# Patient Record
Sex: Male | Born: 1985
Health system: Southern US, Community
[De-identification: ages and names within clinical notes are randomized; demographics above are authoritative.]

## PROBLEM LIST (undated history)

## (undated) DIAGNOSIS — J45909 Unspecified asthma, uncomplicated: Secondary | ICD-10-CM

---
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
## 2008-12-15 ENCOUNTER — Emergency Department (HOSPITAL_COMMUNITY): Admission: EM | Admit: 2008-12-15 | Discharge: 2008-12-15 | Payer: Self-pay | Admitting: Emergency Medicine

## 2011-03-12 LAB — DIFFERENTIAL
Basophils Absolute: 0 10*3/uL (ref 0.0–0.1)
Basophils Relative: 0 % (ref 0–1)
Eosinophils Absolute: 0 10*3/uL (ref 0.0–0.7)
Eosinophils Relative: 0 % (ref 0–5)
Monocytes Absolute: 0.9 10*3/uL (ref 0.1–1.0)
Neutro Abs: 12.5 10*3/uL — ABNORMAL HIGH (ref 1.7–7.7)

## 2011-03-12 LAB — CBC
HCT: 44.7 % (ref 39.0–52.0)
Hemoglobin: 14.7 g/dL (ref 13.0–17.0)
MCHC: 32.8 g/dL (ref 30.0–36.0)
Platelets: 200 10*3/uL (ref 150–400)
RDW: 12.1 % (ref 11.5–15.5)

## 2011-03-12 LAB — RAPID STREP SCREEN (MED CTR MEBANE ONLY): Streptococcus, Group A Screen (Direct): NEGATIVE

## 2011-06-09 ENCOUNTER — Emergency Department (HOSPITAL_COMMUNITY)
Admission: EM | Admit: 2011-06-09 | Discharge: 2011-06-09 | Disposition: A | Discharge status: Home / Self Care | Payer: Self-pay | Attending: Emergency Medicine | Admitting: Emergency Medicine

## 2011-06-09 DIAGNOSIS — J45909 Unspecified asthma, uncomplicated: Secondary | ICD-10-CM | POA: Insufficient documentation

## 2011-06-09 DIAGNOSIS — R634 Abnormal weight loss: Secondary | ICD-10-CM | POA: Insufficient documentation

## 2011-06-09 DIAGNOSIS — K5900 Constipation, unspecified: Secondary | ICD-10-CM | POA: Insufficient documentation

## 2011-06-09 DIAGNOSIS — R109 Unspecified abdominal pain: Secondary | ICD-10-CM | POA: Insufficient documentation

## 2011-06-09 DIAGNOSIS — IMO0001 Reserved for inherently not codable concepts without codable children: Secondary | ICD-10-CM | POA: Insufficient documentation

## 2011-06-09 DIAGNOSIS — R110 Nausea: Secondary | ICD-10-CM | POA: Insufficient documentation

## 2011-06-09 LAB — DIFFERENTIAL
Basophils Absolute: 0.1 10*3/uL (ref 0.0–0.1)
Lymphs Abs: 2.6 10*3/uL (ref 0.7–4.0)
Monocytes Absolute: 0.6 10*3/uL (ref 0.1–1.0)
Neutrophils Relative %: 45 % (ref 43–77)

## 2011-06-09 LAB — COMPREHENSIVE METABOLIC PANEL
ALT: 16 U/L (ref 0–53)
AST: 18 U/L (ref 0–37)
Alkaline Phosphatase: 54 U/L (ref 39–117)
CO2: 27 mEq/L (ref 19–32)
Chloride: 104 mEq/L (ref 96–112)
GFR calc Af Amer: >60 mL/min (ref >60)
GFR calc non Af Amer: >60 mL/min (ref >60)
Glucose, Bld: 83 mg/dL (ref 70–99)
Potassium: 4 mEq/L (ref 3.5–5.1)

## 2011-06-09 LAB — URINALYSIS, ROUTINE W REFLEX MICROSCOPIC
Bilirubin Urine: NEGATIVE
Hgb urine dipstick: NEGATIVE
Ketones, ur: NEGATIVE mg/dL
Nitrite: NEGATIVE
Urobilinogen, UA: 1 mg/dL (ref 0.0–1.0)

## 2011-06-09 LAB — CBC
MCHC: 34.8 g/dL (ref 30.0–36.0)
Platelets: 247 10*3/uL (ref 150–400)
RDW: 11.5 % (ref 11.5–15.5)
WBC: 6.3 10*3/uL (ref 4.0–10.5)

## 2011-06-09 LAB — CK TOTAL AND CKMB (NOT AT ARMC)
CK, MB: 1 ng/mL (ref 0.3–4.0)
Total CK: 109 U/L (ref 7–232)

## 2013-07-07 ENCOUNTER — Emergency Department (HOSPITAL_COMMUNITY)
Admission: EM | Admit: 2013-07-07 | Discharge: 2013-07-07 | Disposition: A | Discharge status: Home Health | Payer: Self-pay | Attending: Emergency Medicine | Admitting: Emergency Medicine

## 2013-07-07 ENCOUNTER — Encounter (HOSPITAL_COMMUNITY): Payer: Self-pay | Admitting: Emergency Medicine

## 2013-07-07 DIAGNOSIS — S61209A Unspecified open wound of unspecified finger without damage to nail, initial encounter: Secondary | ICD-10-CM | POA: Insufficient documentation

## 2013-07-07 DIAGNOSIS — W268XXA Contact with other sharp object(s), not elsewhere classified, initial encounter: Secondary | ICD-10-CM | POA: Insufficient documentation

## 2013-07-07 DIAGNOSIS — Z23 Encounter for immunization: Secondary | ICD-10-CM | POA: Insufficient documentation

## 2013-07-07 DIAGNOSIS — Y9289 Other specified places as the place of occurrence of the external cause: Secondary | ICD-10-CM | POA: Insufficient documentation

## 2013-07-07 DIAGNOSIS — Y9389 Activity, other specified: Secondary | ICD-10-CM | POA: Insufficient documentation

## 2013-07-07 DIAGNOSIS — S61219A Laceration without foreign body of unspecified finger without damage to nail, initial encounter: Secondary | ICD-10-CM

## 2013-07-07 DIAGNOSIS — S61409A Unspecified open wound of unspecified hand, initial encounter: Secondary | ICD-10-CM | POA: Insufficient documentation

## 2013-07-07 MED ORDER — LIDOCAINE HCL 2 % IJ SOLN: 10.0000 mL | Freq: Once | INTRAMUSCULAR | Status: DC

## 2013-07-07 MED ORDER — HYDROCODONE-ACETAMINOPHEN 5-325 MG PO TABS: 2.0000 | ORAL_TABLET | ORAL | Status: DC | PRN

## 2013-07-07 MED ORDER — TETANUS-DIPHTH-ACELL PERTUSSIS 5-2.5-18.5 LF-MCG/0.5 IM SUSP
0.5000 mL | Freq: Once | INTRAMUSCULAR | Status: AC
  Administered 2013-07-07: 0.5 mL via INTRAMUSCULAR
  Filled 2013-07-07: qty 0.5

## 2013-07-07 NOTE — ED Provider Notes (Signed)
  CSN: [id number]     Arrival date & time 07/07/13  0100 History     First MD Initiated Contact with Patient 07/07/13 0118     Chief Complaint  Patient presents with  . Hand Injury   (Consider location/radiation/quality/duration/timing/severity/associated sxs/prior Treatment) Patient is a 27 y.o. male presenting with hand injury. The history is provided by the patient. No language interpreter was used.  Hand Injury Associated symptoms: no fever     27 year old male presents for evaluations of left middle finger laceration. Incident happened 1 hr ago.  Patient sustained a cut to his L middle and ring fingers this evening while trying to reach under the car seat. Unsure what cut his fingers.  Denies numbness, or any other injury.  Unsure date of last tetanus.    History reviewed. No pertinent past medical history. History reviewed. No pertinent past surgical history. No family history on file. History  Substance Use Topics  . Smoking status: Not on file  . Smokeless tobacco: Not on file  . Alcohol Use: Not on file    Review of Systems  Constitutional: Negative for fever.  Skin: Positive for wound.  Neurological: Negative for numbness.    Allergies  Review of patient's allergies indicates no known allergies.  Home Medications  No current outpatient prescriptions on file. BP 124/61  Pulse 78  Temp(Src) 98.9 F (37.2 C) (Oral)  Resp 14  SpO2 100% Physical Exam  Constitutional: He appears well-developed and well-nourished. No distress.  HENT:  Head: Atraumatic.  Eyes: Conjunctivae are normal.  Neck: Neck supple.  Musculoskeletal: He exhibits tenderness (superficial laceration noted overlying 1st phalanx of L middle and ring finger.  NVI, no joint involvement, normal ROM through each joints distally.  no fb.).  Skin: Skin is warm and dry.    ED Course   Procedures (including critical care time)  LACERATION REPAIR Performed by: Fayrene Helper Authorized by:  Fayrene Helper Consent: Verbal consent obtained. Risks and benefits: risks, benefits and alternatives were discussed Consent given by: patient Patient identity confirmed: provided demographic data Prepped and Draped in normal sterile fashion Wound explored  Laceration Location: L middle finger, dorsum  Laceration Length: 3cm  No Foreign Bodies seen or palpated  Anesthesia: local infiltration  Local anesthetic: lidocaine 2% w/o epinephrine  Anesthetic total: 1 ml  Irrigation method: syringe Amount of cleaning: standard  Skin closure: prolene 5.0  Number of sutures: 6  Technique: simple interrupted  Patient tolerance: Patient tolerated the procedure well with no immediate complications.  LACERATION REPAIR Performed by: Fayrene Helper Authorized byFayrene Helper Consent: Verbal consent obtained. Risks and benefits: risks, benefits and alternatives were discussed Consent given by: patient Patient identity confirmed: provided demographic data Prepped and Draped in normal sterile fashion Wound explored  Laceration Location: L ring finger, dorsum  Laceration Length: 2cm  No Foreign Bodies seen or palpated  Anesthesia: local infiltration  Local anesthetic: lidocaine 2% w/o epinephrine  Anesthetic total: 1 ml  Irrigation method: syringe Amount of cleaning: standard  Skin closure: prolene 5.0  Number of sutures: 4  Technique: simple interrupted  Patient tolerance: Patient tolerated the procedure well with no immediate complications.    Labs Reviewed - No data to display No results found. 1. Laceration of multiple sites of left hand and fingers without complication     MDM  BP 124/61  Pulse 78  Temp(Src) 98.9 F (37.2 C) (Oral)  Resp 14  SpO2 100%   Fayrene Helper, PA-C 07/07/13 [id number]

## 2013-07-07 NOTE — ED Notes (Signed)
Suture cart at bedside 

## 2013-07-07 NOTE — ED Notes (Signed)
PT. PRESENTS WITH LACERATION AT LEFT MIDDLE Frank Ferguson FINGER SUSTAINED THIS EVENING WHILE TRYING TO REACH UNDER A CAR SEAT . DRESSING APPLIED AT TRIAGE .

## 2013-07-07 NOTE — ED Notes (Signed)
Pt has lacerations to left middle and ring finger. Pressure dressing was applied in triage and bleeding is controlled for now. Pt is a/o x4, NAD. Pt is unsure of last tetanus booster, but states "it has been a while."

## 2013-07-07 NOTE — ED Provider Notes (Signed)
Medical screening examination/treatment/procedure(s) were performed by non-physician practitioner and as supervising physician I was immediately available for consultation/collaboration.  John-Adam Tanmay Halteman, M.D.     John-Adam Kahley Leib, MD 07/07/13 0526 

## 2014-03-04 ENCOUNTER — Emergency Department: Payer: Self-pay | Admitting: General Practice

## 2014-11-13 ENCOUNTER — Encounter (HOSPITAL_COMMUNITY): Payer: Self-pay | Admitting: Emergency Medicine

## 2014-11-13 ENCOUNTER — Emergency Department (HOSPITAL_COMMUNITY)
Admission: EM | Admit: 2014-11-13 | Discharge: 2014-11-13 | Disposition: A | Discharge status: Home / Self Care | Payer: Self-pay | Attending: Emergency Medicine | Admitting: Emergency Medicine

## 2014-11-13 DIAGNOSIS — J45909 Unspecified asthma, uncomplicated: Secondary | ICD-10-CM | POA: Insufficient documentation

## 2014-11-13 DIAGNOSIS — K029 Dental caries, unspecified: Secondary | ICD-10-CM | POA: Insufficient documentation

## 2014-11-13 DIAGNOSIS — Z720 Tobacco use: Secondary | ICD-10-CM | POA: Insufficient documentation

## 2014-11-13 DIAGNOSIS — K0381 Cracked tooth: Secondary | ICD-10-CM | POA: Insufficient documentation

## 2014-11-13 HISTORY — DX: Unspecified asthma, uncomplicated: J45.909

## 2014-11-13 MED ORDER — HYDROCODONE-ACETAMINOPHEN 5-325 MG PO TABS: 1.0000 | ORAL_TABLET | ORAL | Status: DC | PRN

## 2014-11-13 MED ORDER — PENICILLIN V POTASSIUM 500 MG PO TABS: 500.0000 mg | ORAL_TABLET | Freq: Four times a day (QID) | ORAL | Status: AC

## 2014-11-13 NOTE — ED Notes (Signed)
Patient reports having a toothache the last few days with worsening pain. He took advil with only temporary relief.  Pain in left upper jaw, 10/10.

## 2014-11-13 NOTE — Discharge Instructions (Signed)
Dental Care and Dentist Visits Dental care supports good overall health. Regular dental visits can also help you avoid dental pain, bleeding, infection, and other more serious health problems in the future. It is important to keep the mouth healthy because diseases in the teeth, gums, and other oral tissues can spread to other areas of the body. Some problems, such as diabetes, heart disease, and pre-term labor have been associated with poor oral health.  See your dentist every 6 months. If you experience emergency problems such as a toothache or broken tooth, go to the dentist right away. If you see your dentist regularly, you may catch problems early. It is easier to be treated for problems in the early stages.  WHAT TO EXPECT AT A DENTIST VISIT  Your dentist will look for many common oral health problems and recommend proper treatment. At your regular dental visit, you can expect:  Gentle cleaning of the teeth and gums. This includes scraping and polishing. This helps to remove the sticky substance around the teeth and gums (plaque). Plaque forms in the mouth shortly after eating. Over time, plaque hardens on the teeth as tartar. If tartar is not removed regularly, it can cause problems. Cleaning also helps remove stains.  Periodic X-rays. These pictures of the teeth and supporting bone will help your dentist assess the health of your teeth.  Periodic fluoride treatments. Fluoride is a natural mineral shown to help strengthen teeth. Fluoride treatmentinvolves applying a fluoride gel or varnish to the teeth. It is most commonly done in children.  Examination of the mouth, tongue, jaws, teeth, and gums to look for any oral health problems, such as:  Cavities (dental caries). This is decay on the tooth caused by plaque, sugar, and acid in the mouth. It is best to catch a cavity when it is small.  Inflammation of the gums caused by plaque buildup (gingivitis).  Problems with the mouth or malformed  or misaligned teeth.  Oral cancer or other diseases of the soft tissues or jaws. KEEP YOUR TEETH AND GUMS HEALTHY For healthy teeth and gums, follow these general guidelines as well as your dentist's specific advice:  Have your teeth professionally cleaned at the dentist every 6 months.  Brush twice daily with a fluoride toothpaste.  Floss your teeth daily.  Ask your dentist if you need fluoride supplements, treatments, or fluoride toothpaste.  Eat a healthy diet. Reduce foods and drinks with added sugar.  Avoid smoking. TREATMENT FOR ORAL HEALTH PROBLEMS If you have oral health problems, treatment varies depending on the conditions present in your teeth and gums.  Your caregiver will most likely recommend good oral hygiene at each visit.  For cavities, gingivitis, or other oral health disease, your caregiver will perform a procedure to treat the problem. This is typically done at a separate appointment. Sometimes your caregiver will refer you to another dental specialist for specific tooth problems or for surgery. SEEK IMMEDIATE DENTAL CARE IF:  You have pain, bleeding, or soreness in the gum, tooth, jaw, or mouth area.  A permanent tooth becomes loose or separated from the gum socket.  You experience a blow or injury to the mouth or jaw area. Document Released: 07/25/2011 Document Revised: 02/04/2012 Document Reviewed: 07/25/2011 [facility] Patient Information 2015 [place], [place]. This information is not intended to replace advice given to you by your health care provider. Make sure you discuss any questions you have with your health care provider.  Dental Fracture You have a dental fracture or  injury. This can mean the tooth is loose, has a chip in the enamel or is broken. If just the outer enamel is chipped, there is a good chance the tooth will not become infected. The only treatment needed may be to smooth off a rough edge. Fractures into the deeper layers (dentin and pulp)  cause greater pain and are more likely to become infected. These require you to see a dentist as soon as possible to save the tooth. Loose teeth may need to be wired or bonded with a plastic splint to hold them in place. A paste may be painted on the open area of the broken tooth to reduce the pain. Antibiotics and pain medicine may be prescribed. Choosing a soft or liquid diet and rinsing the mouth out with warm water after meals may be helpful. See your dentist as recommended. Failure to seek care or follow up with a dentist or other specialist as recommended could result in the loss of your tooth, infection, or permanent dental problems. SEEK MEDICAL CARE IF:   You have increased pain not controlled with medicines.  You have swelling around the tooth, in the face or neck.  You have bleeding which starts, continues, or gets worse. Dental Caries Dental caries is tooth decay. This decay can cause a hole in teeth (cavity) that can get bigger and deeper over time. HOME CARE Brush and floss your teeth. Do this at least two times a day. Use a fluoride toothpaste. Use a mouth rinse if told by your dentist or doctor. Eat less sugary and starchy foods. Drink less sugary drinks. Avoid snacking often on sugary and starchy foods. Avoid sipping often on sugary drinks. Keep regular checkups and cleanings with your dentist. Use fluoride supplements if told by your dentist or doctor. Allow fluoride to be applied to teeth if told by your dentist or doctor. Document Released: 08/21/2008 Document Revised: 03/29/2014 Document Reviewed: 11/14/2012 [facility] Patient Information 2015 [place], [place]. This information is not intended to replace advice given to you by your health care provider. Make sure you discuss any questions you have with your health care provider.  Emergency Department Resource Guide 1) Find a Doctor and Pay Out of Pocket Although you won't have to find out who is covered by your insurance  plan, it is a good idea to ask around and get recommendations. You will then need to call the office and see if the doctor you have chosen will accept you as a new patient and what types of options they offer for patients who are self-pay. Some doctors offer discounts or will set up payment plans for their patients who do not have insurance, but you will need to ask so you aren't surprised when you get to your appointment.  2) Contact Your Local Health Department Not all health departments have doctors that can see patients for sick visits, but many do, so it is worth a call to see if yours does. If you don't know where your local health department is, you can check in your phone book. The CDC also has a tool to help you locate your state's health department, and many state websites also have listings of all of their local health departments.  3) Find a Walk-in Clinic If your illness is not likely to be very severe or complicated, you may want to try a walk in clinic. These are popping up all over the country in pharmacies, drugstores, and shopping centers. They're usually staffed by nurse practitioners  or physician assistants that have been trained to treat common illnesses and complaints. They're usually fairly quick and inexpensive. However, if you have serious medical issues or chronic medical problems, these are probably not your best option.  No Primary Care Doctor: - Call Health Connect at  [phone] - they can help you locate a primary care doctor that  accepts your insurance, provides certain services, etc. - Physician Referral Service- [phone]  Chronic Pain Problems: Organization         Address  Phone   Notes  [facility]  [phone] Patients need to be referred by their primary care doctor.   Medication Assistance: Organization         Address  Phone   Notes  [facility] Medication [facility] [address] [place], [place] [address]  [phone] --Must be a resident of [facility] -- Must have NO insurance coverage whatsoever (no Medicaid/ Medicare, etc.) -- The pt. MUST have a primary care doctor that directs their care regularly and follows them in the community   [facility]  [phone]   [employer]  [phone]    Agencies that provide inexpensive medical care: Organization         Address                                                       Phone                                                                            Notes  [facility]  [phone]   [facility]    [phone]   [facility] [address] [place], [place] [address] [phone]   [facility] [address] [place] [place] [phone]   Planned Parenthood    [phone]   [facility]    [phone]   [facility]  [address], [place] Phone:  [phone], Fax:  [phone] Hours of Operation:  9 am - 6 pm, M-F.  Also accepts Medicaid/Medicare and self-pay.  [facility]  [address], [place] Phone: [phone], Fax: [phone]. Hours of Operation:  8:30 am - 5:30 pm, M-F.  Also accepts Medicaid and self-pay.  [facility] [place] [address] [place] Phone: [phone]   [facility] [address] Natasha Bence [place], [place] [phone], Ext. [phone] Mondays & Thursdays: 7-9 AM.  First 15 patients are seen on a first come, first serve basis.    Medicaid-accepting [facility] Providers:  Organization         Address  Phone                               Notes  [facility] [address], [place] [phone] Also accepts self-pay patients.  [facility] [address], [place]  [phone]   [facility] [address], [place] [phone]) [phone]   [facility] [address] [place] [address], [place] [phone]) [phone]   Renaye RakersVeita Bland [address], [place]   [phone] Only accepts [place] Access [place] patients after they have their name applied to their card.   Self-Pay (no insurance) in [facility]:   Organization         Address                                                     Phone               Notes  Sickle Cell Patients, [facility] [address], [place] [phone]   [facility] [address], [place] [phone]   [facility]  [address], [place] [phone]   [facility]/Dr. Osei-Bonsu  [address] [place] [address], [place] or [phone], [place] [phone] Phone number for both [place] and [place] locations is the same.  [facility] [address], [place] [phone]) [phone]   [facility]  [address] [place] [address], [place] or [address] [phone]   [facility] [address], [place] [phone], phone; [phone], fax Sees patients 1st and 3rd Saturday of every month.  Must not qualify for public or private insurance (i.e. Medicaid, Medicare, [place] Health Choice, Veterans' Benefits)  Household income should be no more than 200% of the poverty level The clinic cannot treat you if you are pregnant or think you are pregnant  Sexually transmitted diseases are not treated at the clinic.   Dental Care: Organization         Address                                  Phone                       Notes  [facility] [address], [place] [phone] Accepts children up to age 26 who are enrolled in [place] or [place] Health Choice; pregnant women  with a Medicaid card; and children who have applied for Medicaid or [place] Health Choice, but were declined, whose parents can pay a reduced fee at time of service.  [facility] [place]  [address], [place] [phone] Accepts children up to age 26 who are enrolled in [place] or [place] Health Choice; pregnant women with a Medicaid card; and children who have applied for Medicaid or [place] Health Choice, but were declined, whose parents can pay a reduced fee at time of  service.  [facility]  [address] [place], [place] [phone] Patients are seen by appointment only. Walk-ins are not accepted. [facility] will see patients 84 years of age and older. Monday - Tuesday (8am-5pm) Most Wednesdays (8:30-5pm) $30 per visit, cash only  [facility] Adult [facility] Access [facility]  [address], [facility] [phone] Patients are seen by appointment only. Walk-ins are not accepted. [facility] will see patients 43 years of age and older. One Wednesday Evening (Monthly: Volunteer Based).  $30 per visit, cash only  [employer]  [phone] for adults; Children under age 21, call Graduate Pediatric Dentistry at [phone]. Children aged 58-14, please call [phone] to request a pediatric application.  Dental services are provided in all areas of dental care including fillings, crowns and bridges, complete and partial dentures, implants, gum treatment, root canals, and extractions. Preventive care is also provided. Treatment is provided to both adults and children. Patients are selected via a lottery and there is often a waiting list.   [facility] [address] [place]  [phone] [email or web address]   [facility] [address] [place], [place] [phone], Ext. [phone] Second and Fourth Thursday of each month, opens at 6:30 AM; Clinic ends at 9 AM.  Patients are seen on  a first-come first-served basis, and a limited number are seen during each clinic.   [facility]  [address] Ether Griffins [place], [place] [phone]   Eligibility Requirements You must have lived in [place], [place], or [place] counties for at least the last three months.   You cannot be eligible for state or federal sponsored [place], including [employer], [place], or [employer].   You generally cannot be eligible for healthcare insurance through your employer.    How to apply: Eligibility screenings are held every Tuesday and Wednesday afternoon from 1:00 pm until 4:00 pm. You do not need an appointment for the interview!  [facility] [address] [place], [place] [phone]   [facility] Health Department  [phone]   [facility] Health Department  [phone]   [facility] Health Department  [phone]     Dental Resources   Patients with Medicaid: [facility] [phone] [address], [phone] [address], [phone]  If unable to pay, or uninsured, contact [facility] [phone]) or [facility] Department [phone] in [place], [phone] in [facility]) to become qualified for the adult dental clinic  Other Low-Cost Community Dental Services: [facility] [address] Natasha Bence [place], [place], [address]    [phone], Ext. [phone]    2nd and 4th Thursday of the month at 6:30am    10 clients each day by appointment, can sometimes see walk-in patients if someone does not show for an appointment [facility]- [address] Ether Griffins [place], [place], [address]    [phone] [facility] [address], [place], [place], [address]    [phone]  [facility] Health Department- [phone] [facility] Health Department- [phone] [facility] [phone]   You have a fever. Document Released: 12/20/2004 Document Revised:  02/04/2012 Document Reviewed: 10/04/2009 [facility] Patient Information 2015 [place], [place]. This information is not intended to replace advice given to you by your health care provider. Make sure you discuss any questions you have with your health care provider.

## 2014-11-13 NOTE — ED Provider Notes (Signed)
CSN: [id number]     Arrival date & time 11/13/14  [id number] History   First MD Initiated Contact with Patient 11/13/14 [phone]     Chief Complaint  Patient presents with  . Dental Pain     (Consider location/radiation/quality/duration/timing/severity/associated sxs/prior Treatment) HPI  Pt is a 28yo male presenting to ED with c/o gradually worsening left upper tooth pain that started about 1 week ago. Pain is constant, aching and throbbing, waxes and wanes in severity, 10/10 at worse, radiates along left side of face. Has tried Advil with only temporary relief.  States he has a known cracked tooth with fillings but has not f/u with a dentist in "many years"  States he was trying to make it until January 1st when his insurance kicks in but pain was too severe this morning.  Denies fever, chills, n/v/d. Denies difficulty breathing or swallowing.  Past Medical History  Diagnosis Date  . Asthma     as a child   History reviewed. No pertinent past surgical history. History reviewed. No pertinent family history. History  Substance Use Topics  . Smoking status: Current Some Day Smoker  . Smokeless tobacco: Never Used  . Alcohol Use: No    Review of Systems  Constitutional: Negative for fever and chills.  HENT: Positive for dental problem ( left upper dental pain). Negative for congestion, facial swelling, sore throat, trouble swallowing and voice change.   Respiratory: Negative for cough and shortness of breath.   All other systems reviewed and are negative.     Allergies  Review of patient's allergies indicates no known allergies.  Home Medications   Prior to Admission medications   Medication Sig Start Date End Date Taking? Authorizing Provider  ibuprofen (ADVIL,MOTRIN) 200 MG tablet Take 200 mg by mouth every 6 (six) hours as needed for moderate pain.   Yes Historical Provider, MD  HYDROcodone-acetaminophen (NORCO/VICODIN) 5-325 MG per tablet Take 1-2 tablets by mouth every 4 (four)  hours as needed. 11/13/14   Junius FinnerErin O'Malley, PA-C  penicillin v potassium (VEETID) 500 MG tablet Take 1 tablet (500 mg total) by mouth 4 (four) times daily. 11/13/14 11/20/14  Junius FinnerErin O'Malley, PA-C   BP 116/72 mmHg  Pulse 90  Temp(Src) 98.7 F (37.1 C) (Oral)  Resp 18  Ht 5\' 9"  (1.753 m)  Wt 140 lb (63.504 kg)  BMI 20.67 kg/m2  SpO2 98% Physical Exam  Constitutional: He is oriented to person, place, and time. He appears well-developed and well-nourished.  HENT:  Head: Normocephalic and atraumatic.  Nose: Nose normal.  Mouth/Throat: Uvula is midline, oropharynx is clear and moist and mucous membranes are normal. No trismus in the jaw. Abnormal dentition. Dental caries present. No dental abscesses or uvula swelling.    Multiple silver fillings.  Cracked tooth #13. Tenderness to surrounding gingiva w/o gingival erythema or edema. No facial swelling. No discharge or bleeding.  Eyes: EOM are normal.  Neck: Normal range of motion. Neck supple.  Cardiovascular: Normal rate.   Pulmonary/Chest: Effort normal.  Musculoskeletal: Normal range of motion.  Neurological: He is alert and oriented to person, place, and time.  Skin: Skin is warm and dry.  Psychiatric: He has a normal mood and affect. His behavior is normal.  Nursing note and vitals reviewed.   ED Course  Procedures (including critical care time) Labs Review Labs Reviewed - No data to display  Imaging Review No results found.   EKG Interpretation None      MDM   Final diagnoses:  Pain due to dental caries  Cracked tooth    Pt is a 28yo male c/o dental pain. Cracked tooth with multiple fillings present on exam. No gingival abscess. Tooth is tender to touch. No airway involvement. Will tx pain. Rx: norco and PCN. Home care instructions provided. Advised to use dental resource guide provided to establish care with a dentist for further evaluation and treatment. Pt verbalized understanding and agreement with tx  plan.    Junius FinnerErin O'Malley, PA-C 11/13/14 0745  Tomasita CrumbleAdeleke Oni, MD 11/13/14 [phone]

## 2016-02-13 ENCOUNTER — Encounter (HOSPITAL_COMMUNITY): Payer: Self-pay | Admitting: Emergency Medicine

## 2016-02-13 ENCOUNTER — Emergency Department (HOSPITAL_COMMUNITY)
Admission: EM | Admit: 2016-02-13 | Discharge: 2016-02-13 | Disposition: A | Discharge status: Home / Self Care | Payer: BLUE CROSS/BLUE SHIELD | Attending: Emergency Medicine | Admitting: Emergency Medicine

## 2016-02-13 DIAGNOSIS — K5900 Constipation, unspecified: Secondary | ICD-10-CM | POA: Diagnosis present

## 2016-02-13 DIAGNOSIS — F17200 Nicotine dependence, unspecified, uncomplicated: Secondary | ICD-10-CM | POA: Insufficient documentation

## 2016-02-13 DIAGNOSIS — A64 Unspecified sexually transmitted disease: Secondary | ICD-10-CM

## 2016-02-13 DIAGNOSIS — J45909 Unspecified asthma, uncomplicated: Secondary | ICD-10-CM | POA: Insufficient documentation

## 2016-02-13 LAB — COMPREHENSIVE METABOLIC PANEL
ALT: 14 U/L — ABNORMAL LOW (ref 17–63)
ANION GAP: 8 (ref 5–15)
AST: 20 U/L (ref 15–41)
Albumin: 3.4 g/dL — ABNORMAL LOW (ref 3.5–5.0)
Alkaline Phosphatase: 51 U/L (ref 38–126)
BUN: 10 mg/dL (ref 6–20)
CHLORIDE: 104 mmol/L (ref 101–111)
CO2: 28 mmol/L (ref 22–32)
Calcium: 8.8 mg/dL — ABNORMAL LOW (ref 8.9–10.3)
Creatinine, Ser: 1.04 mg/dL (ref 0.61–1.24)
GFR calc Af Amer: >60 mL/min (ref >60)
Glucose, Bld: 103 mg/dL — ABNORMAL HIGH (ref 65–99)
POTASSIUM: 4.4 mmol/L (ref 3.5–5.1)
Sodium: 140 mmol/L (ref 135–145)
Total Bilirubin: 0.2 mg/dL — ABNORMAL LOW (ref 0.3–1.2)
Total Protein: 6.3 g/dL — ABNORMAL LOW (ref 6.5–8.1)

## 2016-02-13 LAB — URINE MICROSCOPIC-ADD ON

## 2016-02-13 LAB — CBC
HEMATOCRIT: 42.1 % (ref 39.0–52.0)
HEMOGLOBIN: 14 g/dL (ref 13.0–17.0)
MCH: 29.1 pg (ref 26.0–34.0)
MCHC: 33.3 g/dL (ref 30.0–36.0)
MCV: 87.5 fL (ref 78.0–100.0)
Platelets: 200 10*3/uL (ref 150–400)
RBC: 4.81 MIL/uL (ref 4.22–5.81)
RDW: 11.6 % (ref 11.5–15.5)
WBC: 8.1 10*3/uL (ref 4.0–10.5)

## 2016-02-13 LAB — URINALYSIS, ROUTINE W REFLEX MICROSCOPIC
Bilirubin Urine: NEGATIVE
GLUCOSE, UA: NEGATIVE mg/dL
Ketones, ur: NEGATIVE mg/dL
NITRITE: NEGATIVE
PH: 7.5 (ref 5.0–8.0)
Protein, ur: 30 mg/dL — AB
SPECIFIC GRAVITY, URINE: 1.025 (ref 1.005–1.030)

## 2016-02-13 LAB — GC/CHLAMYDIA PROBE AMP (~~LOC~~) NOT AT ARMC
CHLAMYDIA, DNA PROBE: NEGATIVE
NEISSERIA GONORRHEA: POSITIVE — AB

## 2016-02-13 LAB — LIPASE, BLOOD: LIPASE: 30 U/L (ref 11–51)

## 2016-02-13 LAB — HIV ANTIBODY (ROUTINE TESTING W REFLEX): HIV SCREEN 4TH GENERATION: NONREACTIVE

## 2016-02-13 MED ORDER — CEFTRIAXONE SODIUM 250 MG IJ SOLR
250.0000 mg | Freq: Once | INTRAMUSCULAR | Status: AC
  Administered 2016-02-13: 250 mg via INTRAMUSCULAR
  Filled 2016-02-13: qty 250

## 2016-02-13 MED ORDER — METRONIDAZOLE 500 MG PO TABS
2000.0000 mg | ORAL_TABLET | Freq: Once | ORAL | Status: AC
  Administered 2016-02-13: 2000 mg via ORAL
  Filled 2016-02-13: qty 4

## 2016-02-13 MED ORDER — AZITHROMYCIN 250 MG PO TABS
1000.0000 mg | ORAL_TABLET | Freq: Once | ORAL | Status: AC
  Administered 2016-02-13: 1000 mg via ORAL
  Filled 2016-02-13: qty 4

## 2016-02-13 MED ORDER — DOCUSATE SODIUM 100 MG PO CAPS: 100.0000 mg | ORAL_CAPSULE | Freq: Two times a day (BID) | ORAL | Status: DC

## 2016-02-13 NOTE — ED Notes (Signed)
C/o constipation since Thursday and dysuria since Friday.  Denies abd pain, nausea, and vomiting.

## 2016-02-13 NOTE — ED Notes (Signed)
EDP at bedside  

## 2016-02-13 NOTE — Discharge Instructions (Signed)
Sexually Transmitted Disease °A sexually transmitted disease (STD) is a disease or infection that may be passed (transmitted) from person to person, usually during sexual activity. This may happen by way of saliva, semen, blood, vaginal mucus, or urine. Common STDs include: °· Gonorrhea. °· Chlamydia. °· Syphilis. °· HIV and AIDS. °· Genital herpes. °· Hepatitis B and C. °· Trichomonas. °· Human papillomavirus (HPV). °· Pubic lice. °· Scabies. °· Mites. °· Bacterial vaginosis. °WHAT ARE CAUSES OF STDs? °An STD may be caused by bacteria, a virus, or parasites. STDs are often transmitted during sexual activity if one person is infected. However, they may also be transmitted through nonsexual means. STDs may be transmitted after:  °· Sexual intercourse with an infected person. °· Sharing sex toys with an infected person. °· Sharing needles with an infected person or using unclean piercing or tattoo needles. °· Having intimate contact with the genitals, mouth, or rectal areas of an infected person. °· Exposure to infected fluids during birth. °WHAT ARE THE SIGNS AND SYMPTOMS OF STDs? °Different STDs have different symptoms. Some people may not have any symptoms. If symptoms are present, they may include: °· Painful or bloody urination. °· Pain in the pelvis, abdomen, vagina, anus, throat, or eyes. °· A skin rash, itching, or irritation. °· Growths, ulcerations, blisters, or sores in the genital and anal areas. °· Abnormal vaginal discharge with or without bad odor. °· Penile discharge in men. °· Fever. °· Pain or bleeding during sexual intercourse. °· Swollen glands in the groin area. °· Yellow skin and eyes (jaundice). This is seen with hepatitis. °· Swollen testicles. °· Infertility. °· Sores and blisters in the mouth. °HOW ARE STDs DIAGNOSED? °To make a diagnosis, your health care provider may: °· Take a medical history. °· Perform a physical exam. °· Take a sample of any discharge to examine. °· Swab the throat,  cervix, opening to the penis, rectum, or vagina for testing. °· Test a sample of your first morning urine. °· Perform blood tests. °· Perform a Pap test, if this applies. °· Perform a colposcopy. °· Perform a laparoscopy. °HOW ARE STDs TREATED? °Treatment depends on the STD. Some STDs may be treated but not cured. °· Chlamydia, gonorrhea, trichomonas, and syphilis can be cured with antibiotic medicine. °· Genital herpes, hepatitis, and HIV can be treated, but not cured, with prescribed medicines. The medicines lessen symptoms. °· Genital warts from HPV can be treated with medicine or by freezing, burning (electrocautery), or surgery. Warts may come back. °· HPV cannot be cured with medicine or surgery. However, abnormal areas may be removed from the cervix, vagina, or vulva. °· If your diagnosis is confirmed, your recent sexual partners need treatment. This is true even if they are symptom-free or have a negative culture or evaluation. They should not have sex until their health care providers say it is okay. °· Your health care provider may test you for infection again 3 months after treatment. °HOW CAN I REDUCE MY RISK OF GETTING AN STD? °Take these steps to reduce your risk of getting an STD: °· Use latex condoms, dental dams, and water-soluble lubricants during sexual activity. Do not use petroleum jelly or oils. °· Avoid having multiple sex partners. °· Do not have sex with someone who has other sex partners °· Do not have sex with anyone you do not know or who is at high risk for an STD. °· Avoid risky sex practices that can break your skin. °· Do not have sex   if you have open sores on your mouth or skin. °· Avoid drinking too much alcohol or taking illegal drugs. Alcohol and drugs can affect your judgment and put you in a vulnerable position. °· Avoid engaging in oral and anal sex acts. °· Get vaccinated for HPV and hepatitis. If you have not received these vaccines in the past, talk to your health care  provider about whether one or both might be right for you. °· If you are at risk of being infected with HIV, it is recommended that you take a prescription medicine daily to prevent HIV infection. This is called pre-exposure prophylaxis (PrEP). You are considered at risk if: °¨ You are a man who has sex with other men (MSM). °¨ You are a heterosexual man or woman and are sexually active with more than one partner. °¨ You take drugs by injection. °¨ You are sexually active with a partner who has HIV. °· Talk with your health care provider about whether you are at high risk of being infected with HIV. If you choose to begin PrEP, you should first be tested for HIV. You should then be tested every 3 months for as long as you are taking PrEP. °WHAT SHOULD I DO IF I THINK I HAVE AN STD? °· See your health care provider. °· Tell your sexual partner(s). They should be tested and treated for any STDs. °· Do not have sex until your health care provider says it is okay. °WHEN SHOULD I GET IMMEDIATE MEDICAL CARE? °Contact your health care provider right away if:  °· You have severe abdominal pain. °· You are a man and notice swelling or pain in your testicles. °· You are a woman and notice swelling or pain in your vagina. °  °This information is not intended to replace advice given to you by your health care provider. Make sure you discuss any questions you have with your health care provider. °  °Document Released: 02/02/2003 Document Revised: 12/03/2014 Document Reviewed: 06/02/2013 °Elsevier Interactive Patient Education ©2016 [employer] ° °

## 2016-02-13 NOTE — ED Provider Notes (Signed)
CSN: [id number]     Arrival date & time 02/13/16  0146 History  By signing my name below, I, Phillis Haggis, attest that this documentation has been prepared under the direction and in the presence of Derwood Kaplan, MD. Electronically Signed: Phillis Haggis, ED Scribe. 02/13/2016. 3:41 AM.  Chief Complaint  Patient presents with  . Dysuria  . Constipation   The history is provided by the patient. No language interpreter was used.  HPI Comments: Frank Ferguson is a 30 y.o. male who presents to the Emergency Department complaining of dysuria onset 2 days ago. Pt reports associated constipation, lower abdominal pain, hematuria, urgency, and yellow penile discharge. He reports his last BM was 3 days ago, which is not typical for him. Pt states that he was tested for STDs in October and had unprotected sex two weeks ago. He reports that he has been monogamous with one sexual partner. He denies nausea or vomiting. Denies rash.   Past Medical History  Diagnosis Date  . Asthma     as a child   History reviewed. No pertinent past surgical history. No family history on file. Social History  Substance Use Topics  . Smoking status: Current Some Day Smoker  . Smokeless tobacco: Never Used  . Alcohol Use: No    Review of Systems  Gastrointestinal: Positive for constipation. Negative for nausea and vomiting.  Genitourinary: Positive for dysuria, hematuria and discharge. Negative for testicular pain.  Musculoskeletal: Negative for back pain.  Skin: Negative for rash.  Allergic/Immunologic: Negative for immunocompromised state.     Allergies  Review of patient's allergies indicates no known allergies.  Home Medications   Prior to Admission medications   Medication Sig Start Date End Date Taking? Authorizing Provider  docusate sodium (COLACE) 100 MG capsule Take 1 capsule (100 mg total) by mouth every 12 (twelve) hours. 02/13/16   Derwood Kaplan, MD  HYDROcodone-acetaminophen (NORCO/VICODIN)  5-325 MG per tablet Take 1-2 tablets by mouth every 4 (four) hours as needed. 11/13/14   Junius Finner, PA-C  ibuprofen (ADVIL,MOTRIN) 200 MG tablet Take 200 mg by mouth every 6 (six) hours as needed for moderate pain.    Historical Provider, MD   BP 127/78 mmHg  Pulse 87  Temp(Src) 99 F (37.2 C) (Oral)  Resp 18  Ht  (1.753 m)  Wt 152 lb (68.947 kg)  BMI 22.44 kg/m2  SpO2 98% Physical Exam  Constitutional: He is oriented to person, place, and time. He appears well-developed and well-nourished. No distress.  HENT:  Head: Normocephalic and atraumatic.  Mouth/Throat: Oropharynx is clear and moist. No oropharyngeal exudate.  Eyes: Conjunctivae and EOM are normal. Pupils are equal, round, and reactive to light.  Neck: Normal range of motion. Neck supple.  Abdominal: Soft. There is no tenderness.  Musculoskeletal: Normal range of motion.  Neurological: He is alert and oriented to person, place, and time.  Skin: Skin is warm and dry.  Psychiatric: He has a normal mood and affect. His behavior is normal.    ED Course  Procedures (including critical care time) DIAGNOSTIC STUDIES: Oxygen Saturation is 98% on RA, normal by my interpretation.    COORDINATION OF CARE: 3:38 AM-Discussed treatment plan which includes labs and colace with pt at bedside and pt agreed to plan. Pt was offered swab vs urine labs for STDs and pt denies swab at this time. He already provided a urine sample which he would like to have cultured for further testing.  Labs Review Labs Reviewed  COMPREHENSIVE METABOLIC PANEL - Abnormal; Notable for the following:    Glucose, Bld 103 (*)    Calcium 8.8 (*)    Total Protein 6.3 (*)    Albumin 3.4 (*)    ALT 14 (*)    Total Bilirubin 0.2 (*)    All other components within normal limits  URINALYSIS, ROUTINE W REFLEX MICROSCOPIC (NOT [facility]) - Abnormal; Notable for the following:    APPearance TURBID (*)    Hgb urine dipstick MODERATE (*)    Protein, ur 30 (*)     Leukocytes, UA LARGE (*)    All other components within normal limits  URINE MICROSCOPIC-ADD ON - Abnormal; Notable for the following:    Squamous Epithelial / LPF 0-5 (*)    Bacteria, UA MANY (*)    All other components within normal limits  LIPASE, BLOOD  CBC  HIV ANTIBODY (ROUTINE TESTING)  GC/CHLAMYDIA PROBE AMP ([place]) NOT AT [facility]    Imaging Review No results found. I have personally reviewed and evaluated these images and lab results as part of my medical decision-making.   EKG Interpretation None      MDM   Final diagnoses:  STD (male)  Constipation, unspecified constipation type    I personally performed the services described in this documentation, which was scribed in my presence. The recorded information has been reviewed and is accurate.  Pt comes in with cc of dysuria. Admits to unprotected intercourse 2 weeks ago, and having yellow discharge from the penis. Denies rash. Prefers urine chlamydia over swab test. Will treat empirically for GC, Chlmaydia. Pt consents to HIV screen. Safe sex counseling completed.  Also has constipation.  Passing flatus and no abd surgery, with no peritoneal signs. Will tx as constipation.   Derwood KaplanAnkit Korbyn Chopin, MD 02/13/16 [phone]

## 2016-02-14 ENCOUNTER — Telehealth: Payer: Self-pay | Admitting: *Deleted

## 2016-06-19 ENCOUNTER — Ambulatory Visit (INDEPENDENT_AMBULATORY_CARE_PROVIDER_SITE_OTHER): Payer: BLUE CROSS/BLUE SHIELD

## 2016-06-19 ENCOUNTER — Ambulatory Visit (INDEPENDENT_AMBULATORY_CARE_PROVIDER_SITE_OTHER): Payer: BLUE CROSS/BLUE SHIELD | Admitting: Physician Assistant

## 2016-06-19 VITALS — BP 90/60 | HR 78 | Temp 98.4°F | Resp 18 | Ht 68.0 in | Wt 148.8 lb

## 2016-06-19 DIAGNOSIS — J988 Other specified respiratory disorders: Secondary | ICD-10-CM | POA: Diagnosis not present

## 2016-06-19 DIAGNOSIS — R05 Cough: Secondary | ICD-10-CM

## 2016-06-19 DIAGNOSIS — R0600 Dyspnea, unspecified: Secondary | ICD-10-CM | POA: Diagnosis not present

## 2016-06-19 DIAGNOSIS — J22 Unspecified acute lower respiratory infection: Secondary | ICD-10-CM

## 2016-06-19 DIAGNOSIS — R059 Cough, unspecified: Secondary | ICD-10-CM

## 2016-06-19 MED ORDER — AZITHROMYCIN 250 MG PO TABS: ORAL_TABLET | ORAL | 0 refills | Status: DC

## 2016-06-19 MED ORDER — BENZONATATE 100 MG PO CAPS: 100.0000 mg | ORAL_CAPSULE | Freq: Three times a day (TID) | ORAL | 0 refills | Status: DC | PRN

## 2016-06-19 MED ORDER — ALBUTEROL SULFATE HFA 108 (90 BASE) MCG/ACT IN AERS: 2.0000 | INHALATION_SPRAY | RESPIRATORY_TRACT | 1 refills | Status: AC | PRN

## 2016-06-19 MED ORDER — GUAIFENESIN ER 1200 MG PO TB12: 1.0000 | ORAL_TABLET | Freq: Two times a day (BID) | ORAL | 1 refills | Status: DC | PRN

## 2016-06-19 NOTE — Patient Instructions (Signed)
Please make sure that you are hydrating well with over 64 oz of water if not more. Please let me know if you are not feeling any better.   You can also call 1800quitnow to help with quitting smoking.  Here is information below.  Please let me know if we can do anything to help facilitate this.    Smoking Cessation, Tips for Success If you are ready to quit smoking, congratulations! You have chosen to help yourself be healthier. Cigarettes bring nicotine, tar, carbon monoxide, and other irritants into your body. Your lungs, heart, and blood vessels will be able to work better without these poisons. There are many different ways to quit smoking. Nicotine gum, nicotine patches, a nicotine inhaler, or nicotine nasal spray can help with physical craving. Hypnosis, support groups, and medicines help break the habit of smoking. WHAT THINGS CAN I DO TO MAKE QUITTING EASIER?  Here are some tips to help you quit for good:  Pick a date when you will quit smoking completely. Tell all of your friends and family about your plan to quit on that date.  Do not try to slowly cut down on the number of cigarettes you are smoking. Pick a quit date and quit smoking completely starting on that day.  Throw away all cigarettes.   Clean and remove all ashtrays from your home, work, and car.  On a card, write down your reasons for quitting. Carry the card with you and read it when you get the urge to smoke.  Cleanse your body of nicotine. Drink enough water and fluids to keep your urine clear or pale yellow. Do this after quitting to flush the nicotine from your body.  Learn to predict your moods. Do not let a bad situation be your excuse to have a cigarette. Some situations in your life might tempt you into wanting a cigarette.  Never have "just one" cigarette. It leads to wanting another and another. Remind yourself of your decision to quit.  Change habits associated with smoking. If you smoked while driving or  when feeling stressed, try other activities to replace smoking. Stand up when drinking your coffee. Brush your teeth after eating. Sit in a different chair when you read the paper. Avoid alcohol while trying to quit, and try to drink fewer caffeinated beverages. Alcohol and caffeine may urge you to smoke.  Avoid foods and drinks that can trigger a desire to smoke, such as sugary or spicy foods and alcohol.  Ask people who smoke not to smoke around you.  Have something planned to do right after eating or having a cup of coffee. For example, plan to take a walk or exercise.  Try a relaxation exercise to calm you down and decrease your stress. Remember, you may be tense and nervous for the first 2 weeks after you quit, but this will pass.  Find new activities to keep your hands busy. Play with a pen, coin, or rubber band. Doodle or draw things on paper.  Brush your teeth right after eating. This will help cut down on the craving for the taste of tobacco after meals. You can also try mouthwash.   Use oral substitutes in place of cigarettes. Try using lemon drops, carrots, cinnamon sticks, or chewing gum. Keep them handy so they are available when you have the urge to smoke.  When you have the urge to smoke, try deep breathing.  Designate your home as a nonsmoking area.  If you are a heavy  smoker, ask your health care provider about a prescription for nicotine chewing gum. It can ease your withdrawal from nicotine.  Reward yourself. Set aside the cigarette money you save and buy yourself something nice.  Look for support from others. Join a support group or smoking cessation program. Ask someone at home or at work to help you with your plan to quit smoking.  Always ask yourself, "Do I need this cigarette or is this just a reflex?" Tell yourself, "Today, I choose not to smoke," or "I do not want to smoke." You are reminding yourself of your decision to quit.  Do not replace cigarette smoking  with electronic cigarettes (commonly called e-cigarettes). The safety of e-cigarettes is unknown, and some may contain harmful chemicals.  If you relapse, do not give up! Plan ahead and think about what you will do the next time you get the urge to smoke. HOW WILL I FEEL WHEN I QUIT SMOKING? You may have symptoms of withdrawal because your body is used to nicotine (the addictive substance in cigarettes). You may crave cigarettes, be irritable, feel very hungry, cough often, get headaches, or have difficulty concentrating. The withdrawal symptoms are only temporary. They are strongest when you first quit but will go away within 10-14 days. When withdrawal symptoms occur, stay in control. Think about your reasons for quitting. Remind yourself that these are signs that your body is healing and getting used to being without cigarettes. Remember that withdrawal symptoms are easier to treat than the major diseases that smoking can cause.  Even after the withdrawal is over, expect periodic urges to smoke. However, these cravings are generally short lived and will go away whether you smoke or not. Do not smoke! WHAT RESOURCES ARE AVAILABLE TO HELP ME QUIT SMOKING? Your health care provider can direct you to community resources or hospitals for support, which may include:  Group support.  Education.  Hypnosis.  Therapy.   This information is not intended to replace advice given to you by your health care provider. Make sure you discuss any questions you have with your health care provider.   Document Released: 08/10/2004 Document Revised: 12/03/2014 Document Reviewed: 04/30/2013 Elsevier Interactive Patient Education [employer]

## 2016-06-19 NOTE — Progress Notes (Signed)
[facility] [address], [place] [address] [phone]  Date:  06/19/2016   Name:  Frank Ferguson   DOB:  Aug 18, 1986   MRN:  [id number]  PCP:  No primary care provider on file.    History of Present Illness:  Frank Ferguson is a 30 y.o. male patient who presents to [facility] for cc of emesis.   Coughing for over a week that has progressively worsened.  Cough drops, day quill, nyquil.  He is not coughing up anything.  Feels as if there is something stuck.  He has never had this before.  He has not used an inhaler in 10 years.  No fever or body aches.  Coughing is worsened at night.  Nasal congestion.  No hx of allergies.   He starts heating up with the coughing.  No hx of blood clots.  No hx of malignancy.  No recent hospitalizations or surgery.  Tobacco use .5 pk per day.  No weight loss.    No hx of reflux.  No epigastric pain, sour brash.  Laying down worsens cough.   He had emesis this morning.     There are no active problems to display for this patient.   Past Medical History:  Diagnosis Date  . Asthma    as a child    No past surgical history on file.  Social History  Substance Use Topics  . Smoking status: Current Some Day Smoker  . Smokeless tobacco: Never Used  . Alcohol use No    No family history on file.  No Known Allergies  Medication list has been reviewed and updated.  No current outpatient prescriptions on file prior to visit.   No current facility-administered medications on file prior to visit.     ROS ROS otherwise unremarkable unless listed above.   Physical Examination: BP 90/60 (Patient Position: Sitting, Cuff Size: Large)   Pulse 78   Temp 98.4 F (36.9 C) (Oral)   Resp 18   Ht  (1.727 m)   Wt 148 lb 12.8 oz (67.5 kg)   SpO2 98%   BMI 22.62 kg/m  Ideal Body Weight: Weight in (lb) to have BMI = 25: 164.1  Physical Exam  Constitutional: He is oriented to person, place, and time. He appears well-developed and  well-nourished. No distress.  HENT:  Head: Atraumatic.  Right Ear: Tympanic membrane, external ear and ear canal normal.  Left Ear: Tympanic membrane, external ear and ear canal normal.  Nose: Mucosal edema and rhinorrhea present. Right sinus exhibits no maxillary sinus tenderness and no frontal sinus tenderness. Left sinus exhibits no maxillary sinus tenderness and no frontal sinus tenderness.  Mouth/Throat: No uvula swelling. No oropharyngeal exudate, posterior oropharyngeal edema or posterior oropharyngeal erythema.  Eyes: Conjunctivae, EOM and lids are normal. Pupils are equal, round, and reactive to light. Right eye exhibits normal extraocular motion. Left eye exhibits normal extraocular motion.  Neck: Trachea normal and full passive range of motion without pain. No edema and no erythema present.  Cardiovascular: Normal rate.   Pulmonary/Chest: Effort normal. No respiratory distress. He has no decreased breath sounds. He has no wheezes. He has no rhonchi.  Neurological: He is alert and oriented to person, place, and time.  Skin: Skin is warm and dry. He is not diaphoretic.  Psychiatric: He has a normal mood and affect. His behavior is normal.   Dg Chest 2 View  Result Date: 06/19/2016 CLINICAL DATA:  Cough and vomiting  for 1 week EXAM: CHEST  2 VIEW COMPARISON:  None. FINDINGS: Lungs are clear. Heart size and pulmonary vascularity are normal. No adenopathy. No bone lesions. IMPRESSION: No edema or consolidation. Electronically Signed   By: Bretta Bang III M.D.   On: 06/19/2016 09:01   Assessment and Plan: Frank Ferguson is a 30 y.o. male who is here today for cc of cough and dyspnea.   --treat abx and mucinex supportively. --rtc as needed.  Cough - Plan: DG Chest 2 View  Dyspnea - Plan: DG Chest 2 View  Lower respiratory infection (e.g., bronchitis, pneumonia, pneumonitis, pulmonitis) - Plan: azithromycin (ZITHROMAX) 250 MG tablet, benzonatate (TESSALON) 100 MG capsule,  Guaifenesin (MUCINEX MAXIMUM STRENGTH) 1200 MG TB12, albuterol (PROVENTIL HFA;VENTOLIN HFA) 108 (90 Base) MCG/ACT inhaler  Trena Platt, PA-C [facility] 06/19/2016 8:33 AM

## 2017-04-22 ENCOUNTER — Emergency Department (HOSPITAL_COMMUNITY)
Admission: EM | Admit: 2017-04-22 | Discharge: 2017-04-22 | Disposition: A | Discharge status: Home / Self Care | Payer: BLUE CROSS/BLUE SHIELD | Attending: Physician Assistant | Admitting: Physician Assistant

## 2017-04-22 ENCOUNTER — Encounter (HOSPITAL_COMMUNITY): Payer: Self-pay | Admitting: Neurology

## 2017-04-22 ENCOUNTER — Emergency Department (HOSPITAL_COMMUNITY): Payer: BLUE CROSS/BLUE SHIELD

## 2017-04-22 DIAGNOSIS — R300 Dysuria: Secondary | ICD-10-CM | POA: Insufficient documentation

## 2017-04-22 DIAGNOSIS — F17200 Nicotine dependence, unspecified, uncomplicated: Secondary | ICD-10-CM | POA: Diagnosis not present

## 2017-04-22 DIAGNOSIS — Z79899 Other long term (current) drug therapy: Secondary | ICD-10-CM | POA: Diagnosis not present

## 2017-04-22 DIAGNOSIS — R369 Urethral discharge, unspecified: Secondary | ICD-10-CM

## 2017-04-22 DIAGNOSIS — K5900 Constipation, unspecified: Secondary | ICD-10-CM | POA: Insufficient documentation

## 2017-04-22 DIAGNOSIS — J45909 Unspecified asthma, uncomplicated: Secondary | ICD-10-CM | POA: Insufficient documentation

## 2017-04-22 LAB — URINALYSIS, ROUTINE W REFLEX MICROSCOPIC
BILIRUBIN URINE: NEGATIVE
Glucose, UA: NEGATIVE mg/dL
KETONES UR: NEGATIVE mg/dL
Nitrite: NEGATIVE
PROTEIN: 30 mg/dL — AB
SQUAMOUS EPITHELIAL / LPF: NONE SEEN
Specific Gravity, Urine: 1.024 (ref 1.005–1.030)
pH: 6 (ref 5.0–8.0)

## 2017-04-22 MED ORDER — CEFTRIAXONE SODIUM 250 MG IJ SOLR
250.0000 mg | Freq: Once | INTRAMUSCULAR | Status: AC
  Administered 2017-04-22: 250 mg via INTRAMUSCULAR
  Filled 2017-04-22: qty 250

## 2017-04-22 MED ORDER — AZITHROMYCIN 250 MG PO TABS
1000.0000 mg | ORAL_TABLET | Freq: Once | ORAL | Status: AC
  Administered 2017-04-22: 1000 mg via ORAL
  Filled 2017-04-22: qty 4

## 2017-04-22 MED ORDER — POLYETHYLENE GLYCOL 3350 17 GM/SCOOP PO POWD: 17.0000 g | Freq: Two times a day (BID) | ORAL | 0 refills | Status: AC

## 2017-04-22 NOTE — ED Provider Notes (Signed)
[facility]-EMERGENCY DEPT Provider Note   CSN: [id number] Arrival date & time: 04/22/17  1013     History   Chief Complaint Chief Complaint  Patient presents with  . Constipation  . Dysuria    HPI Frank Ferguson is a 31 y.o. male.  The history is provided by the patient and medical records.  Constipation   Associated symptoms include dysuria.  Dysuria       31 year old male here with 2 complaints.  1.  Constipation-- reports no BM in 5 days. States this is very abnormal for him. Usually goes at least once daily. He has not had any recent changes in diet. No nausea or vomiting. States his abdomen feels very "full" but denies any abdominal pain. No prior abdominal surgeries. Not currently on narcotic pain medication.  Has not tried anything for his symptoms. Has been able to eat and drink normally, but in small amounts.  2.  Dysuria-- states that the mild dysuria at the beginning of his urinary stream. States has been urinating less than normal he feels, stream has been somewhat weak. Has noticed a small amount of white penile discharge. Denies new sexual partner. No history of kidney stones. No flank pain, fever, or chills.  Past Medical History:  Diagnosis Date  . Asthma    as a child    There are no active problems to display for this patient.   History reviewed. No pertinent surgical history.     Home Medications    Prior to Admission medications   Medication Sig Start Date End Date Taking? Authorizing Provider  albuterol (PROVENTIL HFA;VENTOLIN HFA) 108 (90 Base) MCG/ACT inhaler Inhale 2 puffs into the lungs every 4 (four) hours as needed for wheezing or shortness of breath (cough, shortness of breath or wheezing.). 06/19/16   English, Judeth Cornfield D, PA  azithromycin (ZITHROMAX) 250 MG tablet Take 2 tabs PO x 1 dose, then 1 tab PO QD x 4 days 06/19/16   English, Stephanie D, PA  benzonatate (TESSALON) 100 MG capsule Take 1-2 capsules (100-200 mg total) by mouth 3 (three)  times daily as needed for cough. 06/19/16   Trena Platt D, PA  Guaifenesin (MUCINEX MAXIMUM STRENGTH) 1200 MG TB12 Take 1 tablet (1,200 mg total) by mouth every 12 (twelve) hours as needed. 06/19/16   Garnetta Buddy, PA    Family History No family history on file.  Social History Social History  Substance Use Topics  . Smoking status: Current Some Day Smoker  . Smokeless tobacco: Never Used  . Alcohol use No     Allergies   Patient has no known allergies.   Review of Systems Review of Systems  Gastrointestinal: Positive for constipation.  Genitourinary: Positive for discharge and dysuria.  All other systems reviewed and are negative.    Physical Exam Updated Vital Signs BP 113/81 (BP Location: Left Arm)   Pulse 74   Temp 98.3 F (36.8 C) (Oral)   Resp 16   Ht 5\' 9"  (1.753 m)   Wt 68 kg (150 lb)   SpO2 100%   BMI 22.15 kg/m   Physical Exam  Constitutional: He is oriented to person, place, and time. He appears well-developed and well-nourished.  HENT:  Head: Normocephalic and atraumatic.  Mouth/Throat: Oropharynx is clear and moist.  Eyes: Conjunctivae and EOM are normal. Pupils are equal, round, and reactive to light.  Neck: Normal range of motion.  Cardiovascular: Normal rate, regular rhythm and normal heart sounds.   Pulmonary/Chest: Effort normal  and breath sounds normal.  Abdominal: Soft. Bowel sounds are normal. There is no tenderness.  Soft, benign, nontender  Musculoskeletal: Normal range of motion.  Neurological: He is alert and oriented to person, place, and time.  Skin: Skin is warm and dry.  Psychiatric: He has a normal mood and affect.  Nursing note and vitals reviewed.    ED Treatments / Results  Labs (all labs ordered are listed, but only abnormal results are displayed) Labs Reviewed  URINALYSIS, ROUTINE W REFLEX MICROSCOPIC - Abnormal; Notable for the following:       Result Value   APPearance CLOUDY (*)    Hgb urine dipstick  SMALL (*)    Protein, ur 30 (*)    Leukocytes, UA LARGE (*)    Bacteria, UA RARE (*)    All other components within normal limits  URINE CULTURE  GC/CHLAMYDIA PROBE AMP ([place]    EKG  EKG Interpretation None       Radiology No results found.  Procedures Procedures (including critical care time)  Medications Ordered in ED Medications - No data to display   Initial Impression / Assessment and Plan / ED Course  I have reviewed the triage vital signs and the nursing notes.  Pertinent labs & imaging results that were available during my care of the patient were reviewed by me and considered in my medical decision making (see chart for details).  31 year old male here with 2 complaints.  1.  Constipation-- no BM in about 5 days.  States this is abnormal for him. No narcotics or other constipation causing agents. No nausea or vomiting. Has been able to eat and drink small amounts. Abdomen soft and benign. Abdominal films with constipation noted, no obstructive findings. Will start on MiraLAX.  2.  Dysuria-- reports small amounts of urination and penile discharge. Bladder scan 33 mL pre-urination.  UA with large leuks, rare bacteria.  Gc/chl pending. Patient treated empirically here with Rocephin and azithromycin. He was informed that we will call him if his STD cultures are abnormal.  Recommend to establish care with PCP in the area if any ongoing issues.  Discussed plan with patient, he acknowledged understanding and agreed with plan of care.  Return precautions given for new or worsening symptoms.  Final Clinical Impressions(s) / ED Diagnoses   Final diagnoses:  Constipation  Penile discharge  Dysuria    New Prescriptions Discharge Medication List as of 04/22/2017  1:52 PM    START taking these medications   Details  polyethylene glycol powder (GLYCOLAX/MIRALAX) powder Take 17 g by mouth 2 (two) times daily. Until daily soft stools  OTC, Starting Mon  04/22/2017, Print         Garlon HatchetSanders, Darshana Curnutt M, PA-C 04/22/17 1425    Mackuen, Cindee Saltourteney Lyn, MD 04/23/17 1134

## 2017-04-22 NOTE — ED Triage Notes (Signed)
Pt reports dysuria, weak urine steam since last night. Also constipation. Last BM was 3 days ago. Denies any pain at current. Is concerned he may have STD. Unsure of any exposure?

## 2017-04-22 NOTE — ED Notes (Signed)
Patient transported to X-ray 

## 2017-04-22 NOTE — Discharge Instructions (Signed)
Use miralax twice daily until stools soften.  You can reduce to once a day if it makes them too loose.   We have treated you for STD's here.  We will call you if your test results are abnormal. Follow-up with a primary care doctor in the area if you have ongoing issues. Return to the ED for new or worsening symptoms.

## 2017-04-23 LAB — URINE CULTURE: Culture: NO GROWTH

## 2017-04-23 LAB — GC/CHLAMYDIA PROBE AMP (~~LOC~~) NOT AT ARMC
CHLAMYDIA, DNA PROBE: NEGATIVE
NEISSERIA GONORRHEA: POSITIVE — AB

## 2019-02-03 ENCOUNTER — Emergency Department (HOSPITAL_COMMUNITY)
Admission: EM | Admit: 2019-02-03 | Discharge: 2019-02-03 | Disposition: A | Discharge status: Home / Self Care | Payer: BLUE CROSS/BLUE SHIELD | Attending: Emergency Medicine | Admitting: Emergency Medicine

## 2019-02-03 ENCOUNTER — Other Ambulatory Visit: Payer: Self-pay

## 2019-02-03 ENCOUNTER — Encounter (HOSPITAL_COMMUNITY): Payer: Self-pay | Admitting: *Deleted

## 2019-02-03 DIAGNOSIS — Z0283 Encounter for blood-alcohol and blood-drug test: Secondary | ICD-10-CM | POA: Insufficient documentation

## 2019-02-03 DIAGNOSIS — Y990 Civilian activity done for income or pay: Secondary | ICD-10-CM

## 2019-02-03 DIAGNOSIS — F17200 Nicotine dependence, unspecified, uncomplicated: Secondary | ICD-10-CM | POA: Diagnosis not present

## 2019-02-03 DIAGNOSIS — J45909 Unspecified asthma, uncomplicated: Secondary | ICD-10-CM | POA: Diagnosis not present
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
## 2019-02-03 NOTE — Discharge Instructions (Addendum)
You have been diagnosed today with Work [employer].  At this time there does not appear to be the presence of an emergent medical condition, however there is always the potential for conditions to change. Please read and follow the below instructions.  Please return to the Emergency Department immediately for any new or worsening symptoms. Please be sure to follow up with your Primary Care Provider within one week regarding your visit today; please call their office to schedule an appointment even if you are feeling better for a follow-up visit.
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
## 2019-06-12 ENCOUNTER — Other Ambulatory Visit: Payer: Self-pay

## 2019-10-08 ENCOUNTER — Emergency Department (HOSPITAL_COMMUNITY)
Admission: EM | Admit: 2019-10-08 | Discharge: 2019-10-09 | Disposition: A | Discharge status: Home / Self Care | Payer: BC Managed Care – PPO | Attending: Emergency Medicine | Admitting: Emergency Medicine

## 2019-10-08 ENCOUNTER — Encounter (HOSPITAL_COMMUNITY): Payer: Self-pay | Admitting: Emergency Medicine

## 2019-10-08 ENCOUNTER — Other Ambulatory Visit: Payer: Self-pay

## 2019-10-08 DIAGNOSIS — Z5321 Procedure and treatment not carried out due to patient leaving prior to being seen by health care provider: Secondary | ICD-10-CM | POA: Diagnosis not present

## 2019-10-08 DIAGNOSIS — R109 Unspecified abdominal pain: Secondary | ICD-10-CM | POA: Insufficient documentation

## 2019-10-08 LAB — URINALYSIS, ROUTINE W REFLEX MICROSCOPIC
Bilirubin Urine: NEGATIVE
Glucose, UA: NEGATIVE mg/dL
Hgb urine dipstick: NEGATIVE
Ketones, ur: NEGATIVE mg/dL
Nitrite: NEGATIVE
Protein, ur: NEGATIVE mg/dL
Specific Gravity, Urine: 1.018 (ref 1.005–1.030)
WBC, UA: >50 WBC/hpf — ABNORMAL HIGH (ref 0–5)
pH: 7 (ref 5.0–8.0)

## 2019-10-08 MED ORDER — SODIUM CHLORIDE 0.9% FLUSH: 3.0000 mL | Freq: Once | INTRAVENOUS | Status: DC

## 2019-10-08 NOTE — ED Triage Notes (Signed)
Patient with abdominal pain that started about one week ago.  Patient denies any nausea or vomiting today, but did have some earlier in the week.  Patient states that he is also having burning sensation when urinating, he did have unprotected sex about one month ago.

## 2019-10-09 LAB — CBC
HCT: 42.5 % (ref 39.0–52.0)
Hemoglobin: 13.7 g/dL (ref 13.0–17.0)
MCH: 29.3 pg (ref 26.0–34.0)
MCHC: 32.2 g/dL (ref 30.0–36.0)
MCV: 90.8 fL (ref 80.0–100.0)
Platelets: 255 10*3/uL (ref 150–400)
RBC: 4.68 MIL/uL (ref 4.22–5.81)
RDW: 11.3 % — ABNORMAL LOW (ref 11.5–15.5)
WBC: 5.7 10*3/uL (ref 4.0–10.5)
nRBC: 0 % (ref 0.0–0.2)

## 2019-10-09 LAB — COMPREHENSIVE METABOLIC PANEL
ALT: 16 U/L (ref 0–44)
AST: 22 U/L (ref 15–41)
Albumin: 4.1 g/dL (ref 3.5–5.0)
Alkaline Phosphatase: 54 U/L (ref 38–126)
Anion gap: 10 (ref 5–15)
BUN: 8 mg/dL (ref 6–20)
CO2: 25 mmol/L (ref 22–32)
Calcium: 9.2 mg/dL (ref 8.9–10.3)
Chloride: 103 mmol/L (ref 98–111)
Creatinine, Ser: 0.97 mg/dL (ref 0.61–1.24)
GFR calc Af Amer: >60 mL/min (ref >60)
GFR calc non Af Amer: >60 mL/min (ref >60)
Glucose, Bld: 90 mg/dL (ref 70–99)
Potassium: 3.9 mmol/L (ref 3.5–5.1)
Sodium: 138 mmol/L (ref 135–145)
Total Bilirubin: 0.8 mg/dL (ref 0.3–1.2)
Total Protein: 7.7 g/dL (ref 6.5–8.1)

## 2019-10-09 LAB — LIPASE, BLOOD: Lipase: 25 U/L (ref 11–51)

## 2019-10-09 NOTE — ED Notes (Signed)
Pt.just left without being seen

## 2019-10-11 ENCOUNTER — Other Ambulatory Visit: Payer: Self-pay

## 2019-10-11 ENCOUNTER — Emergency Department (HOSPITAL_COMMUNITY)
Admission: EM | Admit: 2019-10-11 | Discharge: 2019-10-11 | Disposition: A | Discharge status: Home / Self Care | Payer: BC Managed Care – PPO | Attending: Emergency Medicine | Admitting: Emergency Medicine

## 2019-10-11 ENCOUNTER — Emergency Department (HOSPITAL_COMMUNITY): Payer: BC Managed Care – PPO

## 2019-10-11 ENCOUNTER — Encounter (HOSPITAL_COMMUNITY): Payer: Self-pay | Admitting: Emergency Medicine

## 2019-10-11 DIAGNOSIS — K859 Acute pancreatitis without necrosis or infection, unspecified: Secondary | ICD-10-CM

## 2019-10-11 DIAGNOSIS — R369 Urethral discharge, unspecified: Secondary | ICD-10-CM | POA: Diagnosis not present

## 2019-10-11 DIAGNOSIS — F17210 Nicotine dependence, cigarettes, uncomplicated: Secondary | ICD-10-CM | POA: Diagnosis not present

## 2019-10-11 DIAGNOSIS — R1033 Periumbilical pain: Secondary | ICD-10-CM

## 2019-10-11 DIAGNOSIS — J45909 Unspecified asthma, uncomplicated: Secondary | ICD-10-CM | POA: Diagnosis not present

## 2019-10-11 LAB — COMPREHENSIVE METABOLIC PANEL
ALT: 15 U/L (ref 0–44)
AST: 23 U/L (ref 15–41)
Albumin: 4 g/dL (ref 3.5–5.0)
Alkaline Phosphatase: 52 U/L (ref 38–126)
Anion gap: 11 (ref 5–15)
BUN: 10 mg/dL (ref 6–20)
CO2: 24 mmol/L (ref 22–32)
Calcium: 9 mg/dL (ref 8.9–10.3)
Chloride: 105 mmol/L (ref 98–111)
Creatinine, Ser: 1.18 mg/dL (ref 0.61–1.24)
GFR calc Af Amer: >60 mL/min (ref >60)
GFR calc non Af Amer: >60 mL/min (ref >60)
Glucose, Bld: 107 mg/dL — ABNORMAL HIGH (ref 70–99)
Potassium: 4 mmol/L (ref 3.5–5.1)
Sodium: 140 mmol/L (ref 135–145)
Total Bilirubin: 0.5 mg/dL (ref 0.3–1.2)
Total Protein: 7.5 g/dL (ref 6.5–8.1)

## 2019-10-11 LAB — CBC
HCT: 42.3 % (ref 39.0–52.0)
Hemoglobin: 13.5 g/dL (ref 13.0–17.0)
MCH: 29.6 pg (ref 26.0–34.0)
MCHC: 31.9 g/dL (ref 30.0–36.0)
MCV: 92.8 fL (ref 80.0–100.0)
Platelets: 256 10*3/uL (ref 150–400)
RBC: 4.56 MIL/uL (ref 4.22–5.81)
RDW: 11.4 % — ABNORMAL LOW (ref 11.5–15.5)
WBC: 6.7 10*3/uL (ref 4.0–10.5)
nRBC: 0 % (ref 0.0–0.2)

## 2019-10-11 LAB — URINALYSIS, ROUTINE W REFLEX MICROSCOPIC
Bacteria, UA: NONE SEEN
Bilirubin Urine: NEGATIVE
Glucose, UA: NEGATIVE mg/dL
Hgb urine dipstick: NEGATIVE
Ketones, ur: NEGATIVE mg/dL
Nitrite: NEGATIVE
Protein, ur: NEGATIVE mg/dL
Specific Gravity, Urine: >1.046 — ABNORMAL HIGH (ref 1.005–1.030)
WBC, UA: >50 WBC/hpf — ABNORMAL HIGH (ref 0–5)
pH: 6 (ref 5.0–8.0)

## 2019-10-11 LAB — LIPASE, BLOOD: Lipase: 52 U/L — ABNORMAL HIGH (ref 11–51)

## 2019-10-11 MED ORDER — SODIUM CHLORIDE 0.9% FLUSH: 3.0000 mL | Freq: Once | INTRAVENOUS | Status: DC

## 2019-10-11 MED ORDER — TRAMADOL HCL 50 MG PO TABS: 50.0000 mg | ORAL_TABLET | Freq: Four times a day (QID) | ORAL | 0 refills | Status: AC | PRN

## 2019-10-11 MED ORDER — CEFTRIAXONE SODIUM 250 MG IJ SOLR
250.0000 mg | Freq: Once | INTRAMUSCULAR | Status: AC
  Administered 2019-10-11: 16:00:00 250 mg via INTRAMUSCULAR
  Filled 2019-10-11: qty 250

## 2019-10-11 MED ORDER — AZITHROMYCIN 250 MG PO TABS
1000.0000 mg | ORAL_TABLET | Freq: Once | ORAL | Status: AC
  Administered 2019-10-11: 16:00:00 1000 mg via ORAL
  Filled 2019-10-11: qty 4

## 2019-10-11 MED ORDER — IOHEXOL 300 MG/ML  SOLN
100.0000 mL | Freq: Once | INTRAMUSCULAR | Status: AC | PRN
  Administered 2019-10-11: 14:00:00 100 mL via INTRAVENOUS

## 2019-10-11 MED ORDER — LIDOCAINE HCL (PF) 1 % IJ SOLN
INTRAMUSCULAR | Status: AC
  Administered 2019-10-11: 16:00:00 1 mL
  Filled 2019-10-11: qty 5

## 2019-10-11 NOTE — ED Triage Notes (Signed)
C/o generalized abd pain and constipation x 1 week.  Also reports penile discharge x 2 days.  States he came to ED a few days ago but had to leave before getting the results.

## 2019-10-11 NOTE — Discharge Instructions (Addendum)
Return here as needed.  Your pancreatic enzyme was elevated slightly and that could be causing your pain.  Follow a clear liquid diet over the next 24 to 48 hours.

## 2019-10-11 NOTE — ED Notes (Signed)
Patient transported to CT 

## 2019-10-11 NOTE — ED Notes (Signed)
Patient verbalizes understanding of discharge instructions. Opportunity for questioning and answers were provided. Armband removed by staff, pt discharged from ED.  

## 2019-10-11 NOTE — ED Provider Notes (Signed)
[facility] EMERGENCY DEPARTMENT Provider Note   CSN: [id number] Arrival date & time: 10/11/19  [id number]     History   Chief Complaint Chief Complaint  Patient presents with  . Abdominal Pain  . Constipation    HPI Frank Ferguson is a 33 y.o. male.     HPI Patient presents to the emergency department with abdominal discomfort that is been ongoing for the last week.  The patient is also complaining of penile discharge.  Patient states his discharge started 5 days ago.  Patient states his pain is around the mid part of his abdomen.  Patient states that nothing seems make the condition better or worse.  The patient states that the pain is not radiating.  The patient denies chest pain, shortness of breath, headache,blurred vision, neck pain, fever, cough, weakness, numbness, dizziness, anorexia, edema, nausea, vomiting, diarrhea, rash, back pain, dysuria, hematemesis, bloody stool, near syncope, or syncope. Past Medical History:  Diagnosis Date  . Asthma    as a child    There are no active problems to display for this patient.   History reviewed. No pertinent surgical history.      Home Medications    Prior to Admission medications   Medication Sig Start Date End Date Taking? Authorizing Provider  albuterol (PROVENTIL HFA;VENTOLIN HFA) 108 (90 Base) MCG/ACT inhaler Inhale 2 puffs into the lungs every 4 (four) hours as needed for wheezing or shortness of breath (cough, shortness of breath or wheezing.). 06/19/16   English, Stephanie D, PA  polyethylene glycol powder (GLYCOLAX/MIRALAX) powder Take 17 g by mouth 2 (two) times daily. Until daily soft stools  OTC 04/22/17   Garlon HatchetSanders, Lisa M, PA-C    Family History No family history on file.  Social History Social History   Tobacco Use  . Smoking status: Current Some Day Smoker  . Smokeless tobacco: Never Used  Substance Use Topics  . Alcohol use: No  . Drug use: No     Allergies   Patient has no known  allergies.   Review of Systems Review of Systems All other systems negative except as documented in the HPI. All pertinent positives and negatives as reviewed in the HPI. Physical Exam Updated Vital Signs BP 121/90 (BP Location: Right Arm)   Pulse 75   Temp 97.8 F (36.6 C) (Oral)   Resp 14   SpO2 100%   Physical Exam Vitals signs and nursing note reviewed.  Constitutional:      General: He is not in acute distress.    Appearance: He is well-developed.  HENT:     Head: Normocephalic and atraumatic.  Eyes:     Pupils: Pupils are equal, round, and reactive to light.  Neck:     Musculoskeletal: Normal range of motion and neck supple.  Cardiovascular:     Rate and Rhythm: Normal rate and regular rhythm.     Heart sounds: Normal heart sounds. No murmur. No friction rub. No gallop.   Pulmonary:     Effort: Pulmonary effort is normal. No respiratory distress.     Breath sounds: Normal breath sounds. No wheezing.  Abdominal:     General: Bowel sounds are normal. There is no distension.     Palpations: Abdomen is soft.     Tenderness: There is no abdominal tenderness.  Skin:    General: Skin is warm and dry.     Capillary Refill: Capillary refill takes less than 2 seconds.     Findings: No erythema  or rash.  Neurological:     Mental Status: He is alert and oriented to person, place, and time.     Motor: No abnormal muscle tone.     Coordination: Coordination normal.  Psychiatric:        Behavior: Behavior normal.      ED Treatments / Results  Labs (all labs ordered are listed, but only abnormal results are displayed) Labs Reviewed  LIPASE, BLOOD - Abnormal; Notable for the following components:      Result Value   Lipase 52 (*)    All other components within normal limits  COMPREHENSIVE METABOLIC PANEL - Abnormal; Notable for the following components:   Glucose, Bld 107 (*)    All other components within normal limits  CBC - Abnormal; Notable for the following  components:   RDW 11.4 (*)    All other components within normal limits  URINALYSIS, ROUTINE W REFLEX MICROSCOPIC  GC/CHLAMYDIA PROBE AMP (Leslie) NOT AT [facility]    EKG None  Radiology Ct Abdomen Pelvis W Contrast  Result Date: 10/11/2019 CLINICAL DATA:  Generalized abdominal pain and constipation, penile discharge EXAM: CT ABDOMEN AND PELVIS WITH CONTRAST TECHNIQUE: Multidetector CT imaging of the abdomen and pelvis was performed using the standard protocol following bolus administration of intravenous contrast. CONTRAST:  OMNIPAQUE IOHEXOL 300 MG/ML  SOLN COMPARISON:  None. FINDINGS: Lower chest: No acute abnormality. Hepatobiliary: No solid liver abnormality is seen. No gallstones, gallbladder wall thickening, or biliary dilatation. Pancreas: Unremarkable. No pancreatic ductal dilatation or surrounding inflammatory changes. Spleen: Normal in size without significant abnormality. Adrenals/Urinary Tract: Adrenal glands are unremarkable. Kidneys are normal, without renal calculi, solid lesion, or hydronephrosis. Bladder is unremarkable. Stomach/Bowel: Stomach is within normal limits. Appendix appears normal. No evidence of bowel wall thickening, distention, or inflammatory changes. No large burden of stool in the colon. Vascular/Lymphatic: No significant vascular findings are present. No enlarged abdominal or pelvic lymph nodes. Reproductive: No mass or other significant abnormality. Other: No abdominal wall hernia or abnormality. No abdominopelvic ascites. Musculoskeletal: No acute or significant osseous findings. IMPRESSION: 1.  No CT findings of the abdomen or pelvis to explain pain. 2.  No large burden of stool in the colon. Electronically Signed   By: Lauralyn Primes M.D.   On: 10/11/2019 15:11    Procedures Procedures (including critical care time)  Medications Ordered in ED Medications  sodium chloride flush (NS) 0.9 % injection 3 mL (has no administration in time range)   cefTRIAXone (ROCEPHIN) injection 250 mg (has no administration in time range)  azithromycin (ZITHROMAX) tablet 1,000 mg (has no administration in time range)  iohexol (OMNIPAQUE) 300 MG/ML solution 100 mL (100 mLs Intravenous Contrast Given 10/11/19 1425)     Initial Impression / Assessment and Plan / ED Course  I have reviewed the triage vital signs and the nursing notes.  Pertinent labs & imaging results that were available during my care of the patient were reviewed by me and considered in my medical decision making (see chart for details).        Patient has an elevated lipase which could be causing his pain in the mid abdomen.  I have advised the patient to follow a clear liquid diet over the next 24 to 48 hours.  Told to return here as needed.  I have advised the patient to abstain from sexual contact over the next 7 days.  Final Clinical Impressions(s) / ED Diagnoses   Final diagnoses:  None  ED Discharge Orders    None       Dalia Heading, PA-C 10/11/19 1527    Lucrezia Starch, MD 10/13/19 [phone]

## 2019-10-11 NOTE — ED Notes (Signed)
Back from CT

## 2019-10-13 LAB — GC/CHLAMYDIA PROBE AMP (~~LOC~~) NOT AT ARMC
Chlamydia: NEGATIVE
Neisseria Gonorrhea: POSITIVE — AB

## 2020-07-29 ENCOUNTER — Other Ambulatory Visit: Payer: BC Managed Care – PPO

## 2020-07-29 ENCOUNTER — Other Ambulatory Visit: Payer: Self-pay | Admitting: Sleep Medicine

## 2020-07-29 DIAGNOSIS — I471 Supraventricular tachycardia: Secondary | ICD-10-CM

## 2020-07-30 LAB — NOVEL CORONAVIRUS, NAA: SARS-CoV-2, NAA: NOT DETECTED

## 2021-04-19 ENCOUNTER — Encounter (HOSPITAL_COMMUNITY): Payer: Self-pay | Admitting: Emergency Medicine

## 2021-04-21 IMAGING — CT CT ABD-PELV W/ CM
2 of 4 series · 16 of 46 positions shown, 18 images · IV contrast (omnipaque)
Comparison: None.

CLINICAL DATA: Generalized abdominal pain and constipation, penile
discharge

EXAM:
CT ABDOMEN AND PELVIS WITH CONTRAST
TECHNIQUE: Multidetector CT imaging of the abdomen and pelvis was performed
using the standard protocol following bolus administration of
intravenous contrast.
CONTRAST:  100mL OMNIPAQUE IOHEXOL 300 MG/ML  SOLN

[Series 3: a/p w/ 5mm · axial · 0.63mm/px · z∈[-925,-520]mm · 13 of 89 slices shown, 15 images]
[im 4/89  soft-tissue]
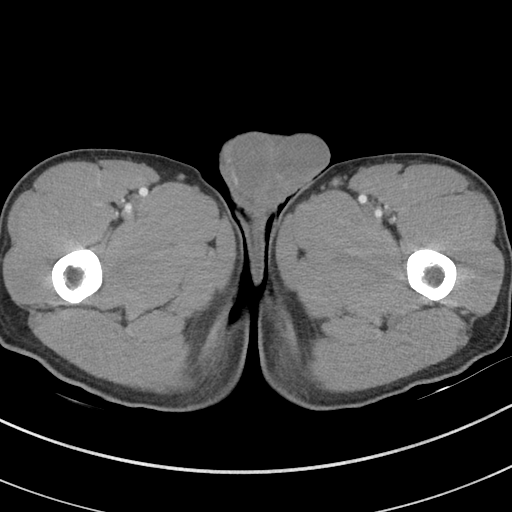
[im 4/89  bone]
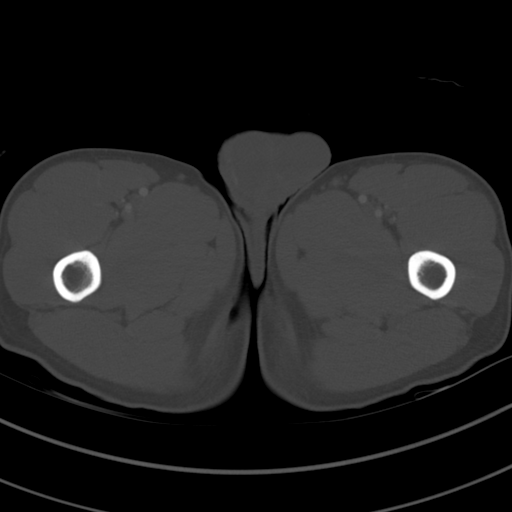
[im 12/89  soft-tissue]
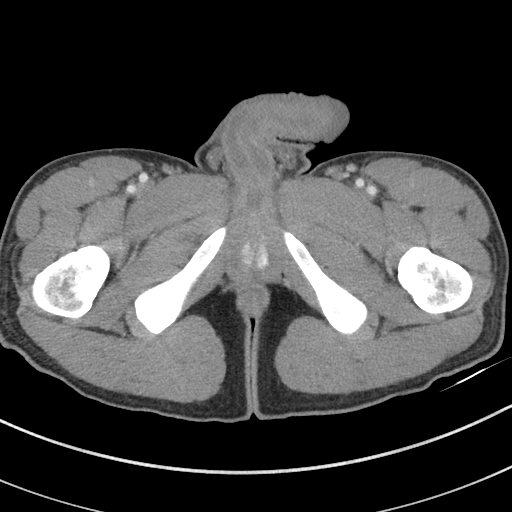
[im 19/89  soft-tissue]
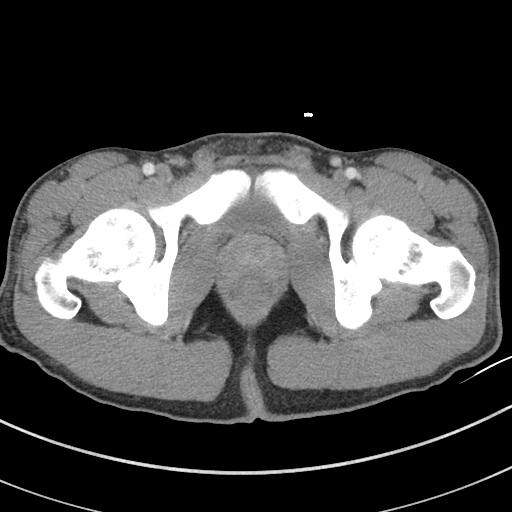
[im 26/89  soft-tissue]
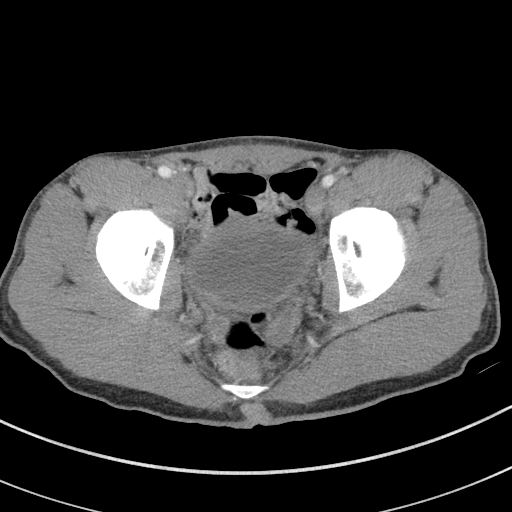
[im 30/89  soft-tissue]
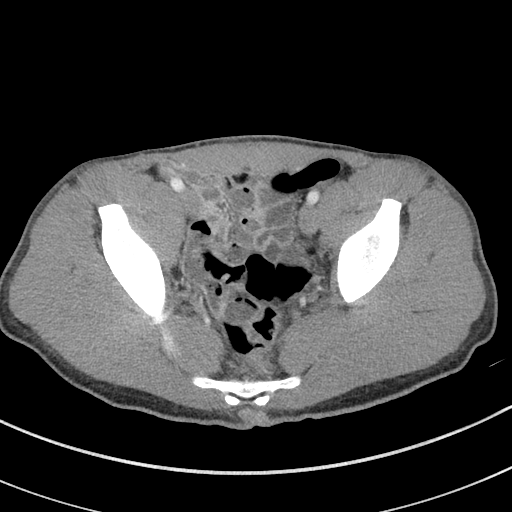
[im 37/89  soft-tissue]
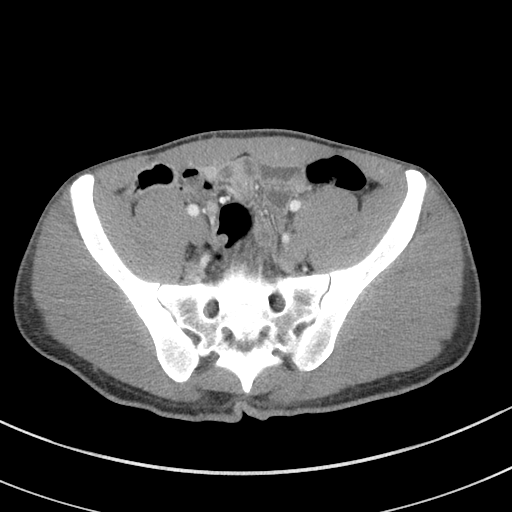
[im 45/89  soft-tissue]
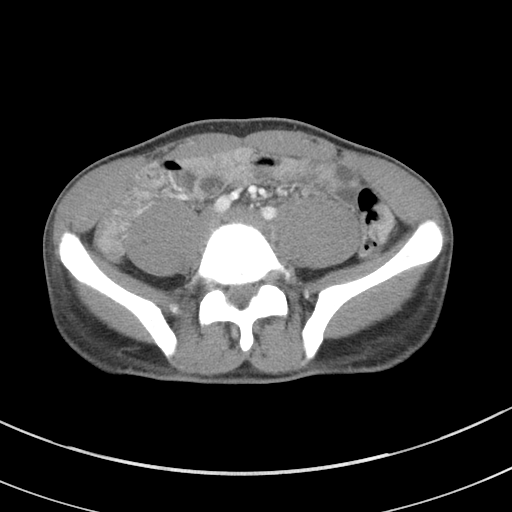
[im 52/89  soft-tissue]
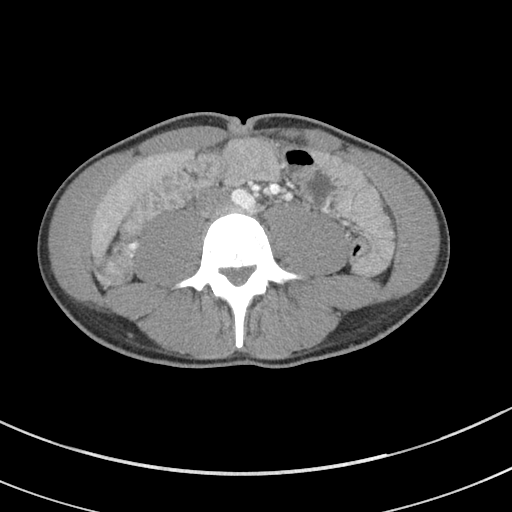
[im 59/89  soft-tissue]
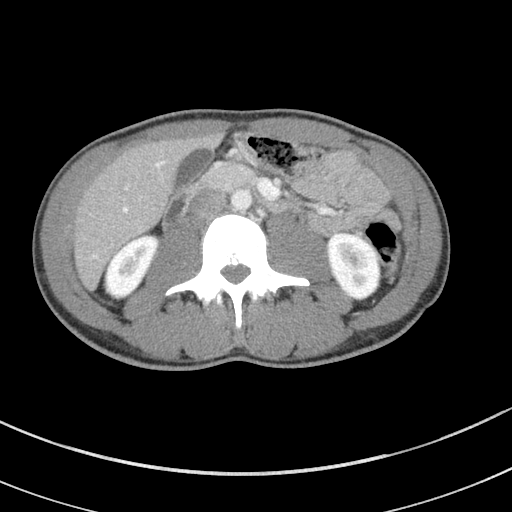
[im 59/89  bone]
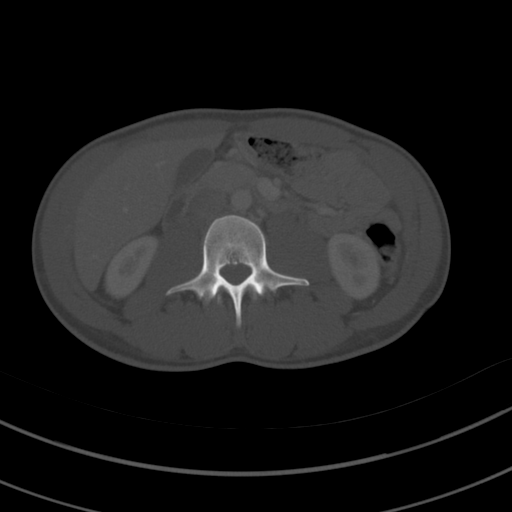
[im 63/89  soft-tissue]
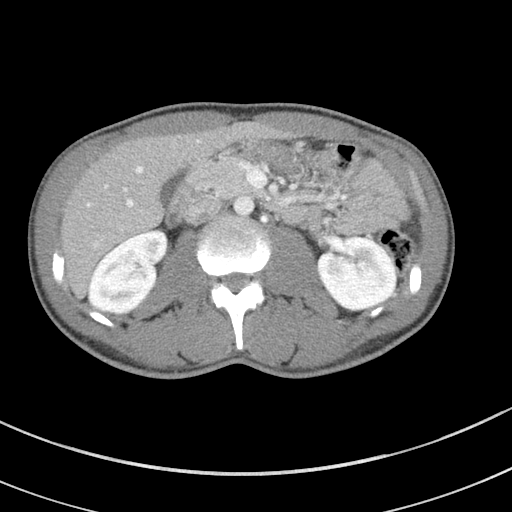
[im 70/89  soft-tissue]
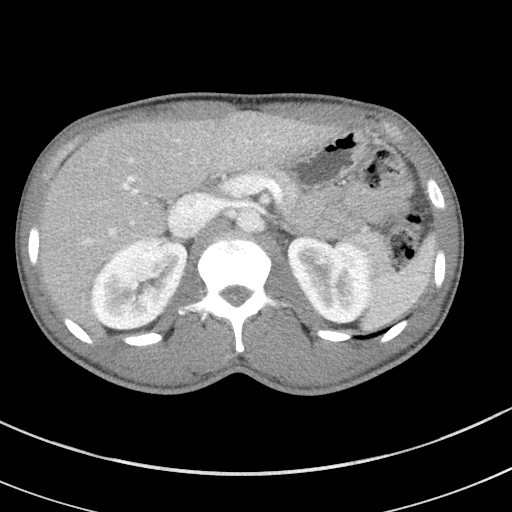
[im 78/89  soft-tissue]
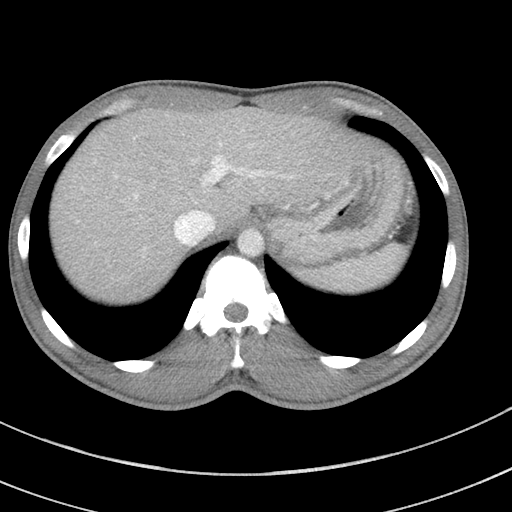
[im 85/89  soft-tissue]
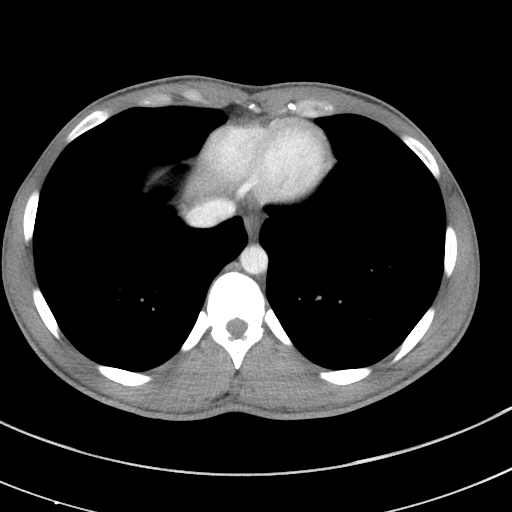

[Series 6: a/p w/ cor · coronal · 0.71mm/px · 3 of 151 slices shown]
[im 51/151  soft-tissue]
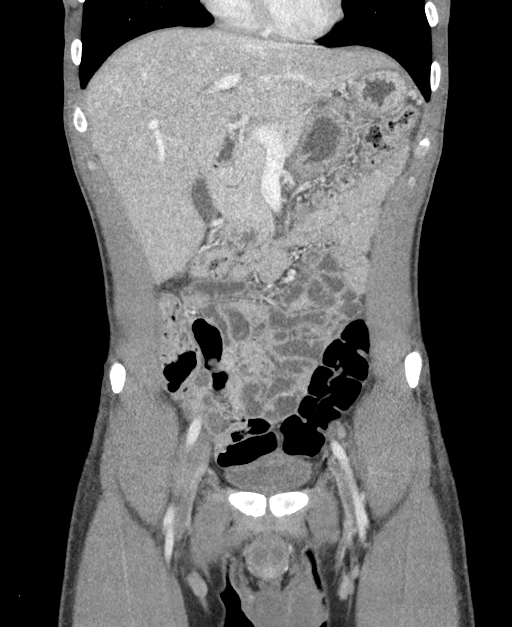
[im 67/151  soft-tissue]
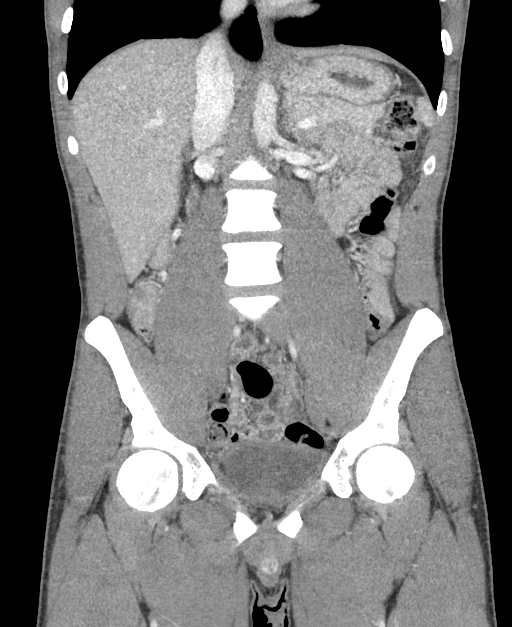
[im 84/151  soft-tissue]
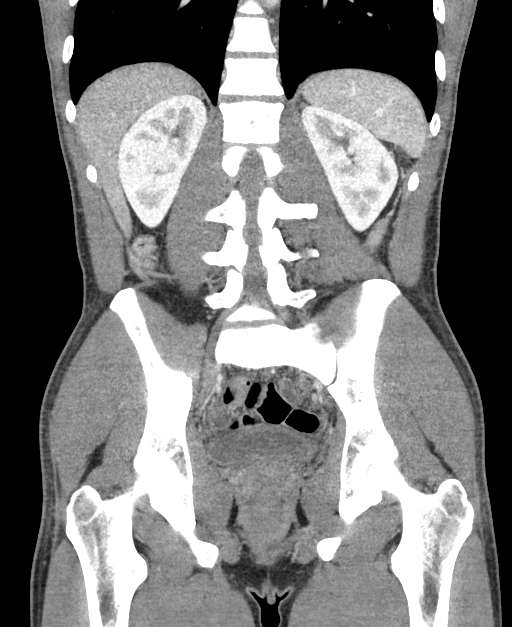

[16 of 46 positions shown; findings below may reference images not displayed]

FINDINGS: Lower chest: No acute abnormality.

Hepatobiliary: No solid liver abnormality is seen. No gallstones,
gallbladder wall thickening, or biliary dilatation.

Pancreas: Unremarkable. No pancreatic ductal dilatation or
surrounding inflammatory changes.

Spleen: Normal in size without significant abnormality.

Adrenals/Urinary Tract: Adrenal glands are unremarkable. Kidneys are
normal, without renal calculi, solid lesion, or hydronephrosis.
Bladder is unremarkable.

Stomach/Bowel: Stomach is within normal limits. Appendix appears
normal. No evidence of bowel wall thickening, distention, or
inflammatory changes. No large burden of stool in the colon.

Vascular/Lymphatic: No significant vascular findings are present. No
enlarged abdominal or pelvic lymph nodes.

Reproductive: No mass or other significant abnormality.

Other: No abdominal wall hernia or abnormality. No abdominopelvic
ascites.

Musculoskeletal: No acute or significant osseous findings.
IMPRESSION: 1.  No CT findings of the abdomen or pelvis to explain pain.

2.  No large burden of stool in the colon.
